# Patient Record
Sex: Female | Born: 1945 | Race: Black or African American | Hispanic: No | Smoking: Never smoker
Health system: Southern US, Community
[De-identification: ages and names within clinical notes are randomized; demographics above are authoritative.]

## PROBLEM LIST (undated history)

## (undated) DIAGNOSIS — D649 Anemia, unspecified: Secondary | ICD-10-CM

## (undated) DIAGNOSIS — K219 Gastro-esophageal reflux disease without esophagitis: Secondary | ICD-10-CM

## (undated) DIAGNOSIS — I10 Essential (primary) hypertension: Secondary | ICD-10-CM

## (undated) HISTORY — PX: ABDOMINAL HYSTERECTOMY: SHX81

---

## 2005-03-06 ENCOUNTER — Other Ambulatory Visit: Payer: Self-pay

## 2005-03-06 ENCOUNTER — Inpatient Hospital Stay: Payer: Self-pay | Admitting: Infectious Diseases

## 2005-03-07 ENCOUNTER — Other Ambulatory Visit: Payer: Self-pay

## 2006-06-25 ENCOUNTER — Ambulatory Visit: Payer: Self-pay | Admitting: Internal Medicine

## 2007-04-06 ENCOUNTER — Other Ambulatory Visit: Payer: Self-pay

## 2007-04-06 ENCOUNTER — Emergency Department: Payer: Self-pay | Admitting: Emergency Medicine

## 2007-10-20 ENCOUNTER — Ambulatory Visit: Payer: Self-pay | Admitting: Internal Medicine

## 2008-06-09 ENCOUNTER — Emergency Department: Payer: Self-pay | Admitting: Emergency Medicine

## 2008-08-30 ENCOUNTER — Ambulatory Visit: Payer: Self-pay | Admitting: Internal Medicine

## 2012-01-19 ENCOUNTER — Ambulatory Visit: Payer: Self-pay | Admitting: Obstetrics and Gynecology

## 2012-01-19 LAB — URINALYSIS, COMPLETE
Bacteria: NONE SEEN
Bilirubin,UR: NEGATIVE
Glucose,UR: NEGATIVE mg/dL
Ketone: NEGATIVE
Leukocyte Esterase: NEGATIVE
Nitrite: NEGATIVE
Ph: 5
Protein: NEGATIVE
RBC,UR: <1 /HPF
Specific Gravity: 1.008
Squamous Epithelial: NONE SEEN
WBC UR: 2 /HPF

## 2012-01-19 LAB — HEMOGLOBIN: HGB: 10.9 g/dL — ABNORMAL LOW

## 2012-02-09 ENCOUNTER — Ambulatory Visit: Payer: Self-pay | Admitting: Obstetrics and Gynecology

## 2012-02-09 LAB — HEMOGLOBIN: HGB: 12.3 g/dL (ref 12.0–16.0)

## 2012-03-17 ENCOUNTER — Emergency Department: Payer: Self-pay | Admitting: Emergency Medicine

## 2012-03-17 LAB — COMPREHENSIVE METABOLIC PANEL
Albumin: 4.6 g/dL (ref 3.4–5.0)
Anion Gap: 12 (ref 7–16)
BUN: 22 mg/dL — ABNORMAL HIGH (ref 7–18)
Co2: 24 mmol/L (ref 21–32)
Creatinine: 0.8 mg/dL (ref 0.60–1.30)
EGFR (African American): >60
EGFR (Non-African Amer.): >60
Glucose: 93 mg/dL (ref 65–99)
Osmolality: 275 (ref 275–301)
SGOT(AST): 22 U/L (ref 15–37)
Total Protein: 8.4 g/dL — ABNORMAL HIGH (ref 6.4–8.2)

## 2012-03-17 LAB — CBC WITH DIFFERENTIAL/PLATELET
Basophil #: 0 10*3/uL (ref 0.0–0.1)
Basophil %: 0.3 %
Eosinophil #: 0.1 10*3/uL (ref 0.0–0.7)
HCT: 33.4 % — ABNORMAL LOW (ref 35.0–47.0)
HGB: 11.1 g/dL — ABNORMAL LOW (ref 12.0–16.0)
Lymphocyte #: 1.1 10*3/uL (ref 1.0–3.6)
Lymphocyte %: 26.2 %
MCH: 28.3 pg (ref 26.0–34.0)
MCHC: 33.2 g/dL (ref 32.0–36.0)
Neutrophil #: 2.7 10*3/uL (ref 1.4–6.5)
Neutrophil %: 63 %
RBC: 3.92 10*6/uL (ref 3.80–5.20)
RDW: 14.3 % (ref 11.5–14.5)

## 2012-03-17 LAB — URINALYSIS, COMPLETE
Bacteria: NONE SEEN
Glucose,UR: NEGATIVE mg/dL (ref 0–75)
Ketone: NEGATIVE
Ph: 5 (ref 4.5–8.0)
RBC,UR: 1 /HPF (ref 0–5)
Specific Gravity: 1.013 (ref 1.003–1.030)
Squamous Epithelial: 1
WBC UR: 3 /HPF (ref 0–5)

## 2012-10-25 ENCOUNTER — Emergency Department: Payer: Self-pay | Admitting: Emergency Medicine

## 2012-10-25 LAB — CBC
HCT: 32.6 % — ABNORMAL LOW (ref 35.0–47.0)
HGB: 10.7 g/dL — ABNORMAL LOW (ref 12.0–16.0)
MCH: 27.5 pg (ref 26.0–34.0)
Platelet: 155 10*3/uL (ref 150–440)
RBC: 3.91 10*6/uL (ref 3.80–5.20)
RDW: 14.5 % (ref 11.5–14.5)
WBC: 8.3 10*3/uL (ref 3.6–11.0)

## 2012-10-25 LAB — URINALYSIS, COMPLETE
Bilirubin,UR: NEGATIVE
Ketone: NEGATIVE
Nitrite: NEGATIVE
Protein: 100
Squamous Epithelial: 4

## 2012-10-25 LAB — COMPREHENSIVE METABOLIC PANEL
Alkaline Phosphatase: 68 U/L (ref 50–136)
Bilirubin,Total: 0.9 mg/dL (ref 0.2–1.0)
Calcium, Total: 8.3 mg/dL — ABNORMAL LOW (ref 8.5–10.1)
Chloride: 104 mmol/L (ref 98–107)
Co2: 26 mmol/L (ref 21–32)
Creatinine: 1.07 mg/dL (ref 0.60–1.30)
Glucose: 97 mg/dL (ref 65–99)
Osmolality: 272 (ref 275–301)
SGOT(AST): 13 U/L — ABNORMAL LOW (ref 15–37)
Sodium: 136 mmol/L (ref 136–145)

## 2014-09-26 ENCOUNTER — Emergency Department: Payer: Self-pay | Admitting: Emergency Medicine

## 2014-11-07 NOTE — Op Note (Signed)
PATIENT NAME:  Amanda Lawrence, Amanda Lawrence MR#:  [id number] DATE OF BIRTH:  [birth year]  DATE OF PROCEDURE:  02/09/2012  PREOPERATIVE DIAGNOSIS: Symptomatic vault relaxation, intolerant of pessary.   POSTOPERATIVE DIAGNOSES:  1. Symptomatic vault relaxation, intolerant of pessary.     2. Multicystic right ovary.   PROCEDURES:   1. Total vaginal hysterectomy.  2. Right salpingo-oophorectomy.  3. Anterior colporrhaphy. 4. Vault suspension.   SURGEON: Ricky Lawrence. Logan BoresEvans, M.D.   ASSISTANT: Jennell Cornerhomas Schermerhorn, M.D.   ANESTHESIA: General endotracheal.   FINDINGS: Postmenopausal vault, early urethral caruncle, firm cystic right ovary, small senile uterus.   ESTIMATED BLOOD LOSS: 30 mL.  COMPLICATIONS: None.  SPECIMENS: Uterus with right ovary.  DRAINS: Foley.  COMPLICATIONS: None.   PROCEDURE IN DETAIL: The patient consented. Preoperative antibiotics were given. The patient was taken to the Operating Room and placed in the supine position. General endotracheal anesthesia was established. She was prepped and draped in the usual sterile fashion. After being positioned in the dorsal lithotomy position using Allen stirrups, the cervix was visualized and grasped with double-tooth tenaculum and injected circumferentially with approximately 12 mL of dilute vasopressin. The cervix was circumscribed with scalpel. Direct entry was made in the posterior cul-de-sac using scissors and long-weighted was placed. A Heaney-Ballantine was used to clamp, cut and tie on the uterosacrals bilaterally. These were tagged. One more clamp on each side allowed us to deliver the uterus which was sent for permanent specimen. The pedicles were ligated with 0 Vicryl.   The right ovary had a multicystic appearance to it and somewhat firm. Therefore, we elected to proceed with right oophorectomy. It was carried out with a clamp of Heaney-Ballantine and a stitch of 0 Vicryl. Areas were checked for hemostasis and seemed to be  excellent.   Uterosacrals were visualized and a Hall band culdoplasty was carried out in the usual fashion using a 0 Vicryl. We then anchored the uterosacrals together and then the posterior two-thirds of the cuff was closed with a running interlocking 0 Vicryl. Next, we turned our attention to the bladder.   An Lavena Bullionllis was placed approximately 2.5 centimeters from the external urethral orifice and an area from the apex of the cuff defect up to the previously placed Allis was injected with dilute vasopressin. It was incised in the midline using Metzenbaum and Allis Adair's were used to grasp these edges to create an incision. The bladder was bluntly and sharply dissected free. Excess bladder tissue was reduced with successive pursestring of 0 Vicryl and then 2-0 Ethibond was used to complete anterior colporrhaphy. Excess skin was trimmed free and anterior defect was closed with a running interlocking including the anterior third of the vaginal cuff. Areas were seen to be hemostatic. Foley catheter was placed with spillage of a good amount of clear urine. Packing was placed, soaked in Premarin cream. This was tied to the Foley.   The patient tolerated the procedure well. She was returned to the supine position, and left in the care of anesthesia. We will discontinue her Foley with packing in the morning and discharge home if appropriate.   ____________________________ Clide Clifficky Lawrence. Logan BoresEvans, MD rle:ap D: 02/09/2012 10:21:28 ET T: 02/09/2012 10:47:36 ET JOB#: [id number]  cc: Clide Clifficky Lawrence. Logan BoresEvans, MD, <Dictator> [name] Laquanda Bick MD ELECTRONICALLY SIGNED 02/11/2012 18:50

## 2014-11-07 NOTE — Discharge Summary (Signed)
PATIENT NAME:  Amanda Lawrence, Amanda Lawrence MR#:  [id number] DATE OF BIRTH:  10-31-1945  DATE OF ADMISSION:  02/09/2012 DATE OF DISCHARGE:  02/11/2012  ADMISSION DIAGNOSIS: Pelvic relaxation and cystic right ovary.   DISCHARGE DIAGNOSIS: Pelvic relaxation and cystic right ovary.   PROCEDURE:  1. Total vaginal hysterectomy. 2. Vault suspension. 3. Anterior repair. 4. Right oophorectomy.   COMPLICATIONS: None.   CONSULTATIONS: None.   HOSPITAL COURSE: Patient did well. She was afebrile throughout, tolerating diet from day one. She was weak, unable to ambulate well on day 1 therefore was kept until day 2. On day 2 she was doing well. Discharged with routine prescriptions, precautions and follow up.   ____________________________ Clide Clifficky Lawrence. Logan BoresEvans, MD rle:cms D: 03/01/2012 07:50:04 ET T: 03/01/2012 14:34:35 ET JOB#: [id number]  cc: Ricky Lawrence. Logan BoresEvans, MD, <Dictator>  [name] [name] MD ELECTRONICALLY SIGNED 03/02/2012 9:24

## 2015-07-05 ENCOUNTER — Encounter: Payer: Self-pay | Admitting: Emergency Medicine

## 2015-07-05 ENCOUNTER — Emergency Department
Admission: EM | Admit: 2015-07-05 | Discharge: 2015-07-05 | Disposition: A | Discharge status: Home / Self Care | Payer: Self-pay | Attending: Emergency Medicine | Admitting: Emergency Medicine

## 2015-07-05 DIAGNOSIS — R531 Weakness: Secondary | ICD-10-CM | POA: Insufficient documentation

## 2015-07-05 DIAGNOSIS — Z880 Allergy status to penicillin: Secondary | ICD-10-CM | POA: Insufficient documentation

## 2015-07-05 DIAGNOSIS — E860 Dehydration: Secondary | ICD-10-CM | POA: Insufficient documentation

## 2015-07-05 DIAGNOSIS — E876 Hypokalemia: Secondary | ICD-10-CM | POA: Insufficient documentation

## 2015-07-05 DIAGNOSIS — K529 Noninfective gastroenteritis and colitis, unspecified: Secondary | ICD-10-CM | POA: Insufficient documentation

## 2015-07-05 LAB — COMPREHENSIVE METABOLIC PANEL
ALT: 16 U/L (ref 14–54)
ANION GAP: 8 (ref 5–15)
AST: 50 U/L — ABNORMAL HIGH (ref 15–41)
Albumin: 3.7 g/dL (ref 3.5–5.0)
Alkaline Phosphatase: 63 U/L (ref 38–126)
BILIRUBIN TOTAL: 0.9 mg/dL (ref 0.3–1.2)
BUN: 25 mg/dL — AB (ref 6–20)
CO2: 24 mmol/L (ref 22–32)
Calcium: 8.7 mg/dL — ABNORMAL LOW (ref 8.9–10.3)
Chloride: 102 mmol/L (ref 101–111)
Creatinine, Ser: 1.12 mg/dL — ABNORMAL HIGH (ref 0.44–1.00)
GFR, EST AFRICAN AMERICAN: 57 mL/min — AB (ref >60)
GFR, EST NON AFRICAN AMERICAN: 49 mL/min — AB (ref >60)
Glucose, Bld: 117 mg/dL — ABNORMAL HIGH (ref 65–99)
POTASSIUM: 2.9 mmol/L — AB (ref 3.5–5.1)
Sodium: 134 mmol/L — ABNORMAL LOW (ref 135–145)
TOTAL PROTEIN: 7.8 g/dL (ref 6.5–8.1)

## 2015-07-05 LAB — CBC WITH DIFFERENTIAL/PLATELET
BASOS ABS: 0 10*3/uL (ref 0–0.1)
BASOS PCT: 0 %
Eosinophils Absolute: 0 10*3/uL (ref 0–0.7)
Eosinophils Relative: 0 %
HEMATOCRIT: 34 % — AB (ref 35.0–47.0)
Hemoglobin: 11 g/dL — ABNORMAL LOW (ref 12.0–16.0)
Lymphocytes Relative: 9 %
Lymphs Abs: 0.4 10*3/uL — ABNORMAL LOW (ref 1.0–3.6)
MCH: 26.9 pg (ref 26.0–34.0)
MCHC: 32.4 g/dL (ref 32.0–36.0)
MCV: 83 fL (ref 80.0–100.0)
MONO ABS: 0.3 10*3/uL (ref 0.2–0.9)
Monocytes Relative: 8 %
NEUTROS ABS: 3.5 10*3/uL (ref 1.4–6.5)
NEUTROS PCT: 83 %
Platelets: 137 10*3/uL — ABNORMAL LOW (ref 150–440)
RBC: 4.1 MIL/uL (ref 3.80–5.20)
RDW: 14 % (ref 11.5–14.5)
WBC: 4.2 10*3/uL (ref 3.6–11.0)

## 2015-07-05 LAB — PHOSPHORUS: PHOSPHORUS: 2 mg/dL — AB (ref 2.5–4.6)

## 2015-07-05 LAB — LIPASE, BLOOD: LIPASE: 28 U/L (ref 11–51)

## 2015-07-05 LAB — MAGNESIUM: Magnesium: 2.1 mg/dL (ref 1.7–2.4)

## 2015-07-05 MED ORDER — PROMETHAZINE HCL 25 MG PO TABS: 25.0000 mg | ORAL_TABLET | Freq: Four times a day (QID) | ORAL | Status: DC | PRN

## 2015-07-05 MED ORDER — CIPROFLOXACIN HCL 500 MG PO TABS: 500.0000 mg | ORAL_TABLET | Freq: Two times a day (BID) | ORAL | Status: DC

## 2015-07-05 MED ORDER — POTASSIUM CHLORIDE CRYS ER 20 MEQ PO TBCR: 20.0000 meq | EXTENDED_RELEASE_TABLET | Freq: Two times a day (BID) | ORAL | Status: DC

## 2015-07-05 MED ORDER — SODIUM CHLORIDE 0.9 % IV BOLUS (SEPSIS)
1000.0000 mL | Freq: Once | INTRAVENOUS | Status: AC
  Administered 2015-07-05: 1000 mL via INTRAVENOUS

## 2015-07-05 MED ORDER — ACETAMINOPHEN 325 MG PO TABS
650.0000 mg | ORAL_TABLET | Freq: Once | ORAL | Status: AC
  Administered 2015-07-05: 650 mg via ORAL
  Filled 2015-07-05: qty 2

## 2015-07-05 MED ORDER — METRONIDAZOLE 500 MG PO TABS: 500.0000 mg | ORAL_TABLET | Freq: Three times a day (TID) | ORAL | Status: DC

## 2015-07-05 MED ORDER — POTASSIUM CHLORIDE CRYS ER 20 MEQ PO TBCR
EXTENDED_RELEASE_TABLET | ORAL | Status: AC
Start: 2015-07-05 — End: 2015-07-05
  Administered 2015-07-05: 40 meq
  Filled 2015-07-05: qty 2

## 2015-07-05 NOTE — ED Notes (Signed)
Pt presents with diarrhea and weakness started yesterday. Denies any vomiting and or pain.

## 2015-07-05 NOTE — Discharge Instructions (Signed)
Dehydration, Adult °Dehydration is a condition in which you do not have enough fluid or water in your body. It happens when you take in less fluid than you lose. Vital organs such as the kidneys, brain, and heart cannot function without a proper amount of fluids. Any loss of fluids from the body can cause dehydration.  °Dehydration can range from mild to severe. This condition should be treated right away to help prevent it from becoming severe. °CAUSES  °This condition may be caused by: °· Vomiting. °· Diarrhea. °· Excessive sweating, such as when exercising in hot or humid weather. °· Not drinking enough fluid during strenuous exercise or during an illness. °· Excessive urine output. °· Fever. °· Certain medicines. °RISK FACTORS °This condition is more likely to develop in: °· People who are taking certain medicines that cause the body to lose excess fluid (diuretics).   °· People who have a chronic illness, such as diabetes, that may increase urination. °· Older adults.   °· People who live at high altitudes.   °· People who participate in endurance sports.   °SYMPTOMS  °Mild Dehydration °· Thirst. °· Dry lips. °· Slightly dry mouth. °· Dry, warm skin. °Moderate Dehydration °· Very dry mouth.   °· Muscle cramps.   °· Dark urine and decreased urine production.   °· Decreased tear production.   °· Headache.   °· Light-headedness, especially when you stand up from a sitting position.   °Severe Dehydration °· Changes in skin.   °¨ Cold and clammy skin.   °¨ Skin does not spring back quickly when lightly pinched and released.   °· Changes in body fluids.   °¨ Extreme thirst.   °¨ No tears.   °¨ Not able to sweat when body temperature is high, such as in hot weather.   °¨ Minimal urine production.   °· Changes in vital signs.   °¨ Rapid, weak pulse (more than 100 beats per minute when you are sitting still).   °¨ Rapid breathing.   °¨ Low blood pressure.   °· Other changes.   °¨ Sunken eyes.   °¨ Cold hands and feet.    °¨ Confusion. °¨ Lethargy and difficulty being awakened. °¨ Fainting (syncope).   °¨ Short-term weight loss.   °¨ Unconsciousness. °DIAGNOSIS  °This condition may be diagnosed based on your symptoms. You may also have tests to determine how severe your dehydration is. These tests may include:  °· Urine tests.   °· Blood tests.   °TREATMENT  °Treatment for this condition depends on the severity. Mild or moderate dehydration can often be treated at home. Treatment should be started right away. Do not wait until dehydration becomes severe. Severe dehydration needs to be treated at the hospital. °Treatment for Mild Dehydration °· Drinking plenty of water to replace the fluid you have lost.   °· Replacing minerals in your blood (electrolytes) that you may have lost.   °Treatment for Moderate Dehydration  °· Consuming oral rehydration solution (ORS). °Treatment for Severe Dehydration °· Receiving fluid through an IV tube.   °· Receiving electrolyte solution through a feeding tube that is passed through your nose and into your stomach (nasogastric tube or NG tube). °· Correcting any abnormalities in electrolytes. °HOME CARE INSTRUCTIONS  °· Drink enough fluid to keep your urine clear or pale yellow.   °· Drink water or fluid slowly by taking small sips. You can also try sucking on ice cubes.  °· Have food or beverages that contain electrolytes. Examples include bananas and sports drinks. °· Take over-the-counter and prescription medicines only as told by your health care provider.   °· Prepare ORS according to the manufacturer's instructions. Take sips   of ORS every 5 minutes until your urine returns to normal. °· If you have vomiting or diarrhea, continue to try to drink water, ORS, or both.   °· If you have diarrhea, avoid:   °¨ Beverages that contain caffeine.   °¨ Fruit juice.   °¨ Milk.    °¨ Carbonated soft drinks. °· Do not take salt tablets. This can lead to the condition of having too much sodium in your body  (hypernatremia).   °SEEK MEDICAL CARE IF: °· You cannot eat or drink without vomiting. °· You have had moderate diarrhea during a period of more than 24 hours. °· You have a fever. °SEEK IMMEDIATE MEDICAL CARE IF:  °· You have extreme thirst. °· You have severe diarrhea. °· You have not urinated in 6-8 hours, or you have urinated only a small amount of very dark urine. °· You have shriveled skin. °· You are dizzy, confused, or both. °  °This information is not intended to replace advice given to you by your health care provider. Make sure you discuss any questions you have with your health care provider. °  °Document Released: 07/07/2005 Document Revised: 03/28/2015 Document Reviewed: 11/22/2014 °Elsevier Interactive Patient Education ©2016 [employer] °Colitis °Colitis is inflammation of the colon. Colitis may last a short time (acute) or it may last a long time (chronic). °CAUSES °This condition may be caused by: °· Viruses. °· Bacteria. °· Reactions to medicine. °· Certain autoimmune diseases, such as Crohn disease or ulcerative colitis. °SYMPTOMS °Symptoms of this condition include: °· Diarrhea. °· Passing bloody or tarry stool. °· Pain. °· Fever. °· Vomiting. °· Tiredness (fatigue). °· Weight loss. °· Bloating. °· Sudden increase in abdominal pain. °· Having fewer bowel movements than usual. °DIAGNOSIS °This condition is diagnosed with a stool test or a blood test. You may also have other tests, including X-rays, a CT scan, or a colonoscopy. °TREATMENT °Treatment may include: °· Resting the bowel. This involves not eating or drinking for a period of time. °· Fluids that are given through an IV tube. °· Medicine for pain and diarrhea. °· Antibiotic medicines. °· Cortisone medicines. °· Surgery. °HOME CARE INSTRUCTIONS °Eating and Drinking °· Follow instructions from your health care provider about eating or drinking restrictions. °· Drink enough fluid to keep your urine clear or pale yellow. °· Work with a  dietitian to determine which foods cause your condition to flare up. °· Avoid foods that cause flare-ups. °· Eat a well-balanced diet. °Medicines °· Take over-the-counter and prescription medicines only as told by your health care provider. °· If you were prescribed an antibiotic medicine, take it as told by your health care provider. Do not stop taking the antibiotic even if you start to feel better. °General Instructions °· Keep all follow-up visits as told by your health care provider. This is important. °SEEK MEDICAL CARE IF: °· Your symptoms do not go away. °· You develop new symptoms. °SEEK IMMEDIATE MEDICAL CARE IF: °· You have a fever that does not go away with treatment. °· You develop chills. °· You have extreme weakness, fainting, or dehydration. °· You have repeated vomiting. °· You develop severe pain in your abdomen. °· You pass bloody or tarry stool. °  °This information is not intended to replace advice given to you by your health care provider. Make sure you discuss any questions you have with your health care provider. °  °Document Released: 08/14/2004 Document Revised: 03/28/2015 Document Reviewed: 10/30/2014 °Elsevier Interactive Patient Education ©2016 [employer] ° °

## 2015-07-05 NOTE — ED Provider Notes (Signed)
[facility] Emergency Department Provider Note  ____________________________________________  Time seen: 8:00 AM  I have reviewed the triage vital signs and the nursing notes.   HISTORY  Chief Complaint Weakness    HPI Amanda Lawrence is a 69 y.o. female who complains of generalized weakness and watery diarrhea that started yesterday morning. She's had frequent watery bowel movements throughout the last 24 hours and progressively more and more fatigued. She stated bed for most of yesterday and all night apart from having to go to the bathroom. She's had multiple episodes of loss of control and soiling her clothes and linens. Denies any sick contacts. No chest pain shortness of breath or fever but does have chills. Denies any abdominal pain or back pain.     History reviewed. No pertinent past medical history.   There are no active problems to display for this patient.    Past Surgical History  Procedure Laterality Date  . Abdominal hysterectomy       Current Outpatient Rx  Name  Route  Sig  Dispense  Refill  . ciprofloxacin (CIPRO) 500 MG tablet   Oral   Take 1 tablet (500 mg total) by mouth 2 (two) times daily.   14 tablet   0   . metroNIDAZOLE (FLAGYL) 500 MG tablet   Oral   Take 1 tablet (500 mg total) by mouth 3 (three) times daily.   30 tablet   0   . potassium chloride SA (K-DUR,KLOR-CON) 20 MEQ tablet   Oral   Take 1 tablet (20 mEq total) by mouth 2 (two) times daily.   10 tablet   0   . promethazine (PHENERGAN) 25 MG tablet   Oral   Take 1 tablet (25 mg total) by mouth every 6 (six) hours as needed for nausea or vomiting.   15 tablet   0      Allergies Penicillins   No family history on file.  Social History Social History  Substance Use Topics  . Smoking status: Never Smoker   . Smokeless tobacco: None  . Alcohol Use: No    Review of Systems  Constitutional:   No fever positive chills. No  weight changes Eyes:   No blurry vision or double vision.  ENT:   No sore throat. Cardiovascular:   No chest pain. Respiratory:   No dyspnea or cough. Gastrointestinal:   Negative for abdominal pain, or vomiting . Patient has frequent watery diarrhea.  No BRBPR or melena. Genitourinary:   Negative for dysuria, urinary retention, bloody urine, or difficulty urinating. Musculoskeletal:   Negative for back pain. No joint swelling or pain. Skin:   Negative for rash. Neurological:   Negative for headaches, focal weakness or numbness. Psychiatric:  No anxiety or depression.   Endocrine:  No hot/cold intolerance, changes in energy, or sleep difficulty.  10-point ROS otherwise negative.  ____________________________________________   PHYSICAL EXAM:  VITAL SIGNS: ED Triage Vitals  Enc Vitals Group     BP 07/05/15 0755 162/53 mmHg     Pulse Rate 07/05/15 0755 110     Resp 07/05/15 0755 18     Temp 07/05/15 0755 102.8 F (39.3 C)     Temp Source 07/05/15 0755 Oral     SpO2 07/05/15 0755 98 %     Weight 07/05/15 0755 210 lb (95.255 kg)     Height 07/05/15 0755  (1.676 m)     Head Cir --      Peak  Flow --      Pain Score --      Pain Loc --      Pain Edu? --      Excl. in GC? --      Constitutional:   Alert and oriented. Well appearing and in no distress. Eyes:   No scleral icterus. No conjunctival pallor. PERRL. EOMI ENT   Head:   Normocephalic and atraumatic.   Nose:   No congestion/rhinnorhea. No septal hematoma   Mouth/Throat:   Dry mucous membranes, no pharyngeal erythema. No peritonsillar mass. No uvula shift.   Neck:   No stridor. No SubQ emphysema. No meningismus. Hematological/Lymphatic/Immunilogical:   No cervical lymphadenopathy. Cardiovascular:   Heart rate 90 at rest supine, increases to 105 when sitting upright.. Normal and symmetric distal pulses are present in all extremities. No murmurs, rubs, or gallops. Respiratory:   Normal respiratory  effort without tachypnea nor retractions. Breath sounds are clear and equal bilaterally. No wheezes/rales/rhonchi. Gastrointestinal:   Soft and nontender. No distention. There is no CVA tenderness.  No rebound, rigidity, or guarding. Genitourinary:   deferred Musculoskeletal:   Nontender with normal range of motion in all extremities. No joint effusions.  No lower extremity tenderness.  No edema. Neurologic:   Normal speech and language.  CN 2-10 normal. Motor grossly intact. No pronator drift.  Normal gait. No gross focal neurologic deficits are appreciated.  Skin:    Skin is warm, dry and intact. No rash noted.  No petechiae, purpura, or bullae. Psychiatric:   Mood and affect are normal. Speech and behavior are normal. Patient exhibits appropriate insight and judgment.  ____________________________________________    LABS (pertinent positives/negatives) (all labs ordered are listed, but only abnormal results are displayed) Labs Reviewed  COMPREHENSIVE METABOLIC PANEL - Abnormal; Notable for the following:    Sodium 134 (*)    Potassium 2.9 (*)    Glucose, Bld 117 (*)    BUN 25 (*)    Creatinine, Ser 1.12 (*)    Calcium 8.7 (*)    AST 50 (*)    GFR calc non Af Amer 49 (*)    GFR calc Af Amer 57 (*)    All other components within normal limits  CBC WITH DIFFERENTIAL/PLATELET - Abnormal; Notable for the following:    Hemoglobin 11.0 (*)    HCT 34.0 (*)    Platelets 137 (*)    Lymphs Abs 0.4 (*)    All other components within normal limits  PHOSPHORUS - Abnormal; Notable for the following:    Phosphorus 2.0 (*)    All other components within normal limits  LIPASE, BLOOD  MAGNESIUM  URINALYSIS COMPLETEWITH MICROSCOPIC ([facility] ONLY)   ____________________________________________   EKG  Interpreted by me Sinus tachycardia rate 100, normal axis intervals QRS and ST segments. Diffuse T-wave inversions in 2-3 aVF, V3 through V6. Frequent PVCs, with 5 PVCs on the strip. T wave  inversions are unchanged from 10/25/2012. The PVCs are new.  ____________________________________________    RADIOLOGY    ____________________________________________   PROCEDURES   ____________________________________________   INITIAL IMPRESSION / ASSESSMENT AND PLAN / ED COURSE  Pertinent labs & imaging results that were available during my care of the patient were reviewed by me and considered in my medical decision making (see chart for details).  Patient presents with generalized weakness without significant pain. Exam is suggestive of dehydration. She may also have some electrolyte abnormalities that are causing her weakness and ectopy on EKG. We'll give IV  fluids and check labs and urinalysis. We'll also check a stool GI panel due to her tachycardia fever and somewhat advanced age.  ----------------------------------------- 9:43 AM on 07/05/2015 -----------------------------------------  Patient feeling about the same, still malaise and fatigue. Heart rate 80 at rest, remain stable when sitting upright. Improvement in orthostatic symptoms. Clinically she has enteritis of unknown etiology. She has not had any bowel movements in the 2 hours she's been in the emergency department side low suspicion that she has C. difficile and she has no risk factors for it. However with her fever and dehydration is resolved and some mild hypokalemia, I will start her on a potassium supplement, antibiotics and nausea medicine as needed. I think she is low risk for hemolytic uremic syndrome. We'll have her follow up with primary care within 1 week.   ____________________________________________   FINAL CLINICAL IMPRESSION(S) / ED DIAGNOSES  Final diagnoses:  Enteritis  Mild dehydration  Hypokalemia      Sharman Cheek, MD 07/05/15 [id number]

## 2015-07-05 NOTE — ED Notes (Signed)
MD at bedside. 

## 2015-07-13 ENCOUNTER — Emergency Department
Admission: EM | Admit: 2015-07-13 | Discharge: 2015-07-13 | Disposition: A | Discharge status: Home / Self Care | Payer: BLUE CROSS/BLUE SHIELD | Attending: Emergency Medicine | Admitting: Emergency Medicine

## 2015-07-13 ENCOUNTER — Emergency Department: Payer: BLUE CROSS/BLUE SHIELD

## 2015-07-13 ENCOUNTER — Encounter: Payer: Self-pay | Admitting: Emergency Medicine

## 2015-07-13 DIAGNOSIS — X58XXXA Exposure to other specified factors, initial encounter: Secondary | ICD-10-CM | POA: Insufficient documentation

## 2015-07-13 DIAGNOSIS — Z880 Allergy status to penicillin: Secondary | ICD-10-CM | POA: Insufficient documentation

## 2015-07-13 DIAGNOSIS — Y9289 Other specified places as the place of occurrence of the external cause: Secondary | ICD-10-CM | POA: Insufficient documentation

## 2015-07-13 DIAGNOSIS — M1711 Unilateral primary osteoarthritis, right knee: Secondary | ICD-10-CM | POA: Diagnosis not present

## 2015-07-13 DIAGNOSIS — Z792 Long term (current) use of antibiotics: Secondary | ICD-10-CM | POA: Insufficient documentation

## 2015-07-13 DIAGNOSIS — Z79899 Other long term (current) drug therapy: Secondary | ICD-10-CM | POA: Diagnosis not present

## 2015-07-13 DIAGNOSIS — S8991XA Unspecified injury of right lower leg, initial encounter: Secondary | ICD-10-CM | POA: Diagnosis present

## 2015-07-13 DIAGNOSIS — Y9389 Activity, other specified: Secondary | ICD-10-CM | POA: Diagnosis not present

## 2015-07-13 DIAGNOSIS — Y998 Other external cause status: Secondary | ICD-10-CM | POA: Diagnosis not present

## 2015-07-13 MED ORDER — NAPROXEN 500 MG PO TABS: 500.0000 mg | ORAL_TABLET | Freq: Two times a day (BID) | ORAL | Status: DC

## 2015-07-13 MED ORDER — HYDROCODONE-ACETAMINOPHEN 5-325 MG PO TABS
2.0000 | ORAL_TABLET | Freq: Once | ORAL | Status: AC
  Administered 2015-07-13: 2 via ORAL

## 2015-07-13 MED ORDER — PREDNISONE 10 MG PO TABS: 50.0000 mg | ORAL_TABLET | Freq: Every day | ORAL | Status: DC

## 2015-07-13 MED ORDER — HYDROCODONE-ACETAMINOPHEN 5-325 MG PO TABS
ORAL_TABLET | ORAL | Status: AC
  Administered 2015-07-13: 2 via ORAL
  Filled 2015-07-13: qty 2

## 2015-07-13 MED ORDER — HYDROCODONE-ACETAMINOPHEN 5-325 MG PO TABS: 1.0000 | ORAL_TABLET | ORAL | Status: DC | PRN

## 2015-07-13 NOTE — ED Provider Notes (Signed)
[facility] Emergency Department Provider Note  ____________________________________________  Time seen: Approximately 9:01 AM  I have reviewed the triage vital signs and the nursing notes.   HISTORY  Chief Complaint Knee Pain    HPI Amanda Lawrence is a 69 y.o. female presents today with complaints of right knee pain times one day. Patient felt a "pop sensation" while bending her knees last PM. States sleeping was difficult.   History reviewed. No pertinent past medical history.  There are no active problems to display for this patient.   Past Surgical History  Procedure Laterality Date  . Abdominal hysterectomy      Current Outpatient Rx  Name  Route  Sig  Dispense  Refill  . ciprofloxacin (CIPRO) 500 MG tablet   Oral   Take 1 tablet (500 mg total) by mouth 2 (two) times daily.   14 tablet   0   . HYDROcodone-acetaminophen (NORCO) 5-325 MG tablet   Oral   Take 1-2 tablets by mouth every 4 (four) hours as needed for moderate pain.   15 tablet   0   . metroNIDAZOLE (FLAGYL) 500 MG tablet   Oral   Take 1 tablet (500 mg total) by mouth 3 (three) times daily.   30 tablet   0   . naproxen (NAPROSYN) 500 MG tablet   Oral   Take 1 tablet (500 mg total) by mouth 2 (two) times daily with a meal.   60 tablet   0   . potassium chloride SA (K-DUR,KLOR-CON) 20 MEQ tablet   Oral   Take 1 tablet (20 mEq total) by mouth 2 (two) times daily.   10 tablet   0   . predniSONE (DELTASONE) 10 MG tablet   Oral   Take 5 tablets (50 mg total) by mouth daily with breakfast.   25 tablet   0   . promethazine (PHENERGAN) 25 MG tablet   Oral   Take 1 tablet (25 mg total) by mouth every 6 (six) hours as needed for nausea or vomiting.   15 tablet   0     Allergies Penicillins  No family history on file.  Social History Social History  Substance Use Topics  . Smoking status: Never Smoker   . Smokeless tobacco: None  . Alcohol  Use: No    Review of Systems Constitutional: No fever/chills Eyes: No visual changes. ENT: No sore throat. Cardiovascular: Denies chest pain. Respiratory: Denies shortness of breath. Gastrointestinal: No abdominal pain.  No nausea, no vomiting.  No diarrhea.  No constipation. Genitourinary: Negative for dysuria. Musculoskeletal: Positive for right knee pain. Skin: Negative for rash. Neurological: Negative for headaches, focal weakness or numbness.  10-point ROS otherwise negative.  ____________________________________________   PHYSICAL EXAM:  VITAL SIGNS: ED Triage Vitals  Enc Vitals Group     BP --      Pulse --      Resp --      Temp --      Temp src --      SpO2 --      Weight --      Height --      Head Cir --      Peak Flow --      Pain Score --      Pain Loc --      Pain Edu? --      Excl. in GC? --     Constitutional: Alert and oriented. Well appearing and  in no acute distress.  Cardiovascular: Normal rate, regular rhythm. Grossly normal heart sounds.  Good peripheral circulation. Respiratory: Normal respiratory effort.  No retractions. Lungs CTAB. Musculoskeletal: Right knee tenderness. No ecchymosis edema or effusion noted. Neurovascularly intact. Patient able to move toes but unable to flex her knee. Neurologic:  Normal speech and language. No gross focal neurologic deficits are appreciated. No gait instability. Skin:  Skin is warm, dry and intact. No rash noted. Psychiatric: Mood and affect are normal. Speech and behavior are normal.  ____________________________________________   LABS (all labs ordered are listed, but only abnormal results are displayed)  Labs Reviewed - No data to display ____________________________________________  RADIOLOGY  Areas of osteoarthritic change. No fracture or dislocation. No appreciable joint effusion. ____________________________________________   PROCEDURES  Procedure(s) performed: None  Critical Care  performed: No  ____________________________________________   INITIAL IMPRESSION / ASSESSMENT AND PLAN / ED COURSE  Pertinent labs & imaging results that were available during my care of the patient were reviewed by me and considered in my medical decision making (see chart for details).  Acute right knee pain. 3 osteoarthritic changes. Rx given for Naprosyn 500 mg twice a day, 5 day burst of prednisone 50 mg daily. Patient to follow up with PCP or return to the ER with any worsening symptomology. Referral given orthopedics on call. ____________________________________________   FINAL CLINICAL IMPRESSION(S) / ED DIAGNOSES  Final diagnoses:  Osteoarthritis of right knee, unspecified osteoarthritis type      Evangeline Dakin, PA-C 07/13/15 1478  Jennye Moccasin, MD 07/13/15 1021

## 2015-07-13 NOTE — ED Notes (Signed)
States she developed right knee pain yesterday  Felt a pop while bending her knee yesterday

## 2015-07-13 NOTE — Discharge Instructions (Signed)
Heat Therapy °Heat therapy can help ease sore, stiff, injured, and tight muscles and joints. Heat relaxes your muscles, which may help ease your pain. Heat therapy should only be used on old, pre-existing, or long-lasting (chronic) injuries. Do not use heat therapy unless told by your doctor. °HOW TO USE HEAT THERAPY °There are several different kinds of heat therapy, including: °· Moist heat pack. °· Warm water bath. °· Hot water bottle. °· Electric heating pad. °· Heated gel pack. °· Heated wrap. °· Electric heating pad. °GENERAL HEAT THERAPY RECOMMENDATIONS  °· Do not sleep while using heat therapy. Only use heat therapy while you are awake. °· Your skin may turn pink while using heat therapy. Do not use heat therapy if your skin turns red. °· Do not use heat therapy if you have new pain. °· High heat or long exposure to heat can cause burns. Be careful when using heat therapy to avoid burning your skin. °· Do not use heat therapy on areas of your skin that are already irritated, such as with a rash or sunburn. °GET HELP IF:  °· You have blisters, redness, swelling (puffiness), or numbness. °· You have new pain. °· Your pain is worse. °MAKE SURE YOU: °· Understand these instructions. °· Will watch your condition. °· Will get help right away if you are not doing well or get worse. °  °This information is not intended to replace advice given to you by your health care provider. Make sure you discuss any questions you have with your health care provider. °  °Document Released: 09/29/2011 Document Revised: 07/28/2014 Document Reviewed: 08/30/2013 °Elsevier Interactive Patient Education ©2016 [employer] ° °Osteoarthritis °Osteoarthritis is a disease that causes soreness and inflammation of a joint. It occurs when the cartilage at the affected joint wears down. Cartilage acts as a cushion, covering the ends of bones where they meet to form a joint. Osteoarthritis is the most common form of arthritis. It often occurs  in older people. The joints affected most often by this condition include those in the: °· Ends of the fingers. °· Thumbs. °· Neck. °· Lower back. °· Knees. °· Hips. °CAUSES  °Over time, the cartilage that covers the ends of bones begins to wear away. This causes bone to rub on bone, producing pain and stiffness in the affected joints.  °RISK FACTORS °Certain factors can increase your chances of having osteoarthritis, including: °· Older age. °· Excessive body weight. °· Overuse of joints. °· Previous joint injury. °SIGNS AND SYMPTOMS  °· Pain, swelling, and stiffness in the joint. °· Over time, the joint may lose its normal shape. °· Small deposits of bone (osteophytes) may grow on the edges of the joint. °· Bits of bone or cartilage can break off and float inside the joint space. This may cause more pain and damage. °DIAGNOSIS  °Your health care provider will do a physical exam and ask about your symptoms. Various tests may be ordered, such as: °· X-rays of the affected joint. °· Blood tests to rule out other types of arthritis. °Additional tests may be used to diagnose your condition. °TREATMENT  °Goals of treatment are to control pain and improve joint function. Treatment plans may include: °· A prescribed exercise program that allows for rest and joint relief. °· A weight control plan. °· Pain relief techniques, such as: °¨ Properly applied heat and cold. °¨ Electric pulses delivered to nerve endings under the skin (transcutaneous electrical nerve stimulation [TENS]). °¨ Massage. °¨ Certain nutritional   supplements. °· Medicines to control pain, such as: °¨ Acetaminophen. °¨ Nonsteroidal anti-inflammatory drugs (NSAIDs), such as naproxen. °¨ Narcotic or central-acting agents, such as tramadol. °¨ Corticosteroids. These can be given orally or as an injection. °· Surgery to reposition the bones and relieve pain (osteotomy) or to remove loose pieces of bone and cartilage. Joint replacement may be needed in advanced  states of osteoarthritis. °HOME CARE INSTRUCTIONS  °· Take medicines only as directed by your health care provider. °· Maintain a healthy weight. Follow your health care provider's instructions for weight control. This may include dietary instructions. °· Exercise as directed. Your health care provider can recommend specific types of exercise. These may include: °¨ Strengthening exercises. These are done to strengthen the muscles that support joints affected by arthritis. They can be performed with weights or with exercise bands to add resistance. °¨ Aerobic activities. These are exercises, such as brisk walking or low-impact aerobics, that get your heart pumping. °¨ Range-of-motion activities. These keep your joints limber. °¨ Balance and agility exercises. These help you maintain daily living skills. °· Rest your affected joints as directed by your health care provider. °· Keep all follow-up visits as directed by your health care provider. °SEEK MEDICAL CARE IF:  °· Your skin turns red. °· You develop a rash in addition to your joint pain. °· You have worsening joint pain. °· You have a fever along with joint or muscle aches. °SEEK IMMEDIATE MEDICAL CARE IF: °· You have a significant loss of weight or appetite. °· You have night sweats. °FOR MORE INFORMATION  °· National Institute of Arthritis and Musculoskeletal and Skin Diseases: www.niams.nih.gov °· National Institute on Aging: www.nia.nih.gov °· American College of Rheumatology: www.rheumatology.org °  °This information is not intended to replace advice given to you by your health care provider. Make sure you discuss any questions you have with your health care provider. °  °Document Released: 07/07/2005 Document Revised: 07/28/2014 Document Reviewed: 03/14/2013 °Elsevier Interactive Patient Education ©2016 Elsevier Inc. ° °

## 2015-07-13 NOTE — ED Notes (Signed)
Having pain to right knee this am  States she bent her knee and felt a pop describes pain as burning type pain and unable bear full wt.

## 2015-07-19 ENCOUNTER — Emergency Department: Payer: BLUE CROSS/BLUE SHIELD

## 2015-07-19 ENCOUNTER — Encounter: Payer: Self-pay | Admitting: Emergency Medicine

## 2015-07-19 ENCOUNTER — Emergency Department
Admission: EM | Admit: 2015-07-19 | Discharge: 2015-07-19 | Disposition: A | Discharge status: Home / Self Care | Payer: BLUE CROSS/BLUE SHIELD | Attending: Emergency Medicine | Admitting: Emergency Medicine

## 2015-07-19 DIAGNOSIS — R1011 Right upper quadrant pain: Secondary | ICD-10-CM

## 2015-07-19 DIAGNOSIS — Z7952 Long term (current) use of systemic steroids: Secondary | ICD-10-CM | POA: Insufficient documentation

## 2015-07-19 DIAGNOSIS — Z791 Long term (current) use of non-steroidal anti-inflammatories (NSAID): Secondary | ICD-10-CM | POA: Insufficient documentation

## 2015-07-19 DIAGNOSIS — Z880 Allergy status to penicillin: Secondary | ICD-10-CM | POA: Insufficient documentation

## 2015-07-19 DIAGNOSIS — Z792 Long term (current) use of antibiotics: Secondary | ICD-10-CM | POA: Insufficient documentation

## 2015-07-19 DIAGNOSIS — N390 Urinary tract infection, site not specified: Secondary | ICD-10-CM | POA: Diagnosis not present

## 2015-07-19 DIAGNOSIS — R109 Unspecified abdominal pain: Secondary | ICD-10-CM

## 2015-07-19 DIAGNOSIS — Z79899 Other long term (current) drug therapy: Secondary | ICD-10-CM | POA: Diagnosis not present

## 2015-07-19 HISTORY — DX: Essential (primary) hypertension: I10

## 2015-07-19 LAB — COMPREHENSIVE METABOLIC PANEL
ALK PHOS: 56 U/L (ref 38–126)
ALT: 16 U/L (ref 14–54)
ANION GAP: 7 (ref 5–15)
AST: 22 U/L (ref 15–41)
Albumin: 3.2 g/dL — ABNORMAL LOW (ref 3.5–5.0)
BILIRUBIN TOTAL: 0.5 mg/dL (ref 0.3–1.2)
BUN: 41 mg/dL — ABNORMAL HIGH (ref 6–20)
CALCIUM: 8.7 mg/dL — AB (ref 8.9–10.3)
CO2: 28 mmol/L (ref 22–32)
Chloride: 106 mmol/L (ref 101–111)
Creatinine, Ser: 1.03 mg/dL — ABNORMAL HIGH (ref 0.44–1.00)
GFR calc non Af Amer: 54 mL/min — ABNORMAL LOW (ref >60)
Glucose, Bld: 90 mg/dL (ref 65–99)
POTASSIUM: 4.1 mmol/L (ref 3.5–5.1)
SODIUM: 141 mmol/L (ref 135–145)
TOTAL PROTEIN: 7 g/dL (ref 6.5–8.1)

## 2015-07-19 LAB — URINALYSIS COMPLETE WITH MICROSCOPIC (ARMC ONLY)
BILIRUBIN URINE: NEGATIVE
Bacteria, UA: NONE SEEN
GLUCOSE, UA: NEGATIVE mg/dL
Hgb urine dipstick: NEGATIVE
KETONES UR: NEGATIVE mg/dL
NITRITE: NEGATIVE
Protein, ur: NEGATIVE mg/dL
SPECIFIC GRAVITY, URINE: 1.031 — AB (ref 1.005–1.030)
pH: 5 (ref 5.0–8.0)

## 2015-07-19 LAB — CBC WITH DIFFERENTIAL/PLATELET
Basophils Absolute: 0.1 10*3/uL (ref 0–0.1)
Basophils Relative: 1 %
EOS ABS: 0.1 10*3/uL (ref 0–0.7)
Eosinophils Relative: 0 %
HCT: 29.4 % — ABNORMAL LOW (ref 35.0–47.0)
HEMOGLOBIN: 9.6 g/dL — AB (ref 12.0–16.0)
LYMPHS ABS: 1.7 10*3/uL (ref 1.0–3.6)
Lymphocytes Relative: 15 %
MCH: 26.6 pg (ref 26.0–34.0)
MCHC: 32.5 g/dL (ref 32.0–36.0)
MCV: 81.8 fL (ref 80.0–100.0)
MONO ABS: 0.6 10*3/uL (ref 0.2–0.9)
MONOS PCT: 5 %
Neutro Abs: 9.3 10*3/uL — ABNORMAL HIGH (ref 1.4–6.5)
Neutrophils Relative %: 79 %
Platelets: 444 10*3/uL — ABNORMAL HIGH (ref 150–440)
RBC: 3.6 MIL/uL — ABNORMAL LOW (ref 3.80–5.20)
RDW: 14.9 % — AB (ref 11.5–14.5)
WBC: 11.7 10*3/uL — AB (ref 3.6–11.0)

## 2015-07-19 LAB — LIPASE, BLOOD: Lipase: 50 U/L (ref 11–51)

## 2015-07-19 MED ORDER — MORPHINE SULFATE (PF) 4 MG/ML IV SOLN
4.0000 mg | Freq: Once | INTRAVENOUS | Status: AC
  Administered 2015-07-19: 4 mg via INTRAVENOUS
  Filled 2015-07-19: qty 1

## 2015-07-19 MED ORDER — SODIUM CHLORIDE 0.9 % IV BOLUS (SEPSIS)
1000.0000 mL | Freq: Once | INTRAVENOUS | Status: AC
  Administered 2015-07-19: 1000 mL via INTRAVENOUS

## 2015-07-19 MED ORDER — IOHEXOL 240 MG/ML SOLN
25.0000 mL | Freq: Once | INTRAMUSCULAR | Status: AC | PRN
  Administered 2015-07-19: 25 mL via ORAL

## 2015-07-19 MED ORDER — SULFAMETHOXAZOLE-TRIMETHOPRIM 800-160 MG PO TABS
1.0000 | ORAL_TABLET | Freq: Once | ORAL | Status: AC
  Administered 2015-07-19: 1 via ORAL
  Filled 2015-07-19: qty 1

## 2015-07-19 MED ORDER — HYDROCODONE-ACETAMINOPHEN 5-325 MG PO TABS: 2.0000 | ORAL_TABLET | Freq: Four times a day (QID) | ORAL | Status: DC | PRN

## 2015-07-19 MED ORDER — IOHEXOL 300 MG/ML  SOLN
100.0000 mL | Freq: Once | INTRAMUSCULAR | Status: AC | PRN
  Administered 2015-07-19: 100 mL via INTRAVENOUS

## 2015-07-19 MED ORDER — SULFAMETHOXAZOLE-TRIMETHOPRIM 800-160 MG PO TABS: 1.0000 | ORAL_TABLET | Freq: Two times a day (BID) | ORAL | Status: DC

## 2015-07-19 MED ORDER — ONDANSETRON HCL 4 MG/2ML IJ SOLN
4.0000 mg | Freq: Once | INTRAMUSCULAR | Status: AC
  Administered 2015-07-19: 4 mg via INTRAVENOUS
  Filled 2015-07-19: qty 2

## 2015-07-19 NOTE — ED Notes (Addendum)
Pt returned from US. Alert and oriented. NAD. Skin warm dry and pink. Respirations unlabored. Pain from right side to right flank.

## 2015-07-19 NOTE — Discharge Instructions (Signed)

## 2015-07-19 NOTE — ED Provider Notes (Signed)
[facility] Emergency Department Provider Note  ____________________________________________  Time seen: 4:30 AM  I have reviewed the triage vital signs and the nursing notes.   HISTORY  Chief Complaint Flank Pain     HPI Amanda Lawrence is a 69 y.o. female presents with right flank pain with acute onset at 8 PM last night current pain score described as 10 out of 10.     Past medical history No pertinent past medical history  There are no active problems to display for this patient.   Past Surgical History  Procedure Laterality Date  . Abdominal hysterectomy      Current Outpatient Rx  Name  Route  Sig  Dispense  Refill  . ciprofloxacin (CIPRO) 500 MG tablet   Oral   Take 1 tablet (500 mg total) by mouth 2 (two) times daily.   14 tablet   0   . HYDROcodone-acetaminophen (NORCO) 5-325 MG tablet   Oral   Take 1-2 tablets by mouth every 4 (four) hours as needed for moderate pain.   15 tablet   0   . metroNIDAZOLE (FLAGYL) 500 MG tablet   Oral   Take 1 tablet (500 mg total) by mouth 3 (three) times daily.   30 tablet   0   . naproxen (NAPROSYN) 500 MG tablet   Oral   Take 1 tablet (500 mg total) by mouth 2 (two) times daily with a meal.   60 tablet   0   . potassium chloride SA (K-DUR,KLOR-CON) 20 MEQ tablet   Oral   Take 1 tablet (20 mEq total) by mouth 2 (two) times daily.   10 tablet   0   . predniSONE (DELTASONE) 10 MG tablet   Oral   Take 5 tablets (50 mg total) by mouth daily with breakfast.   25 tablet   0   . promethazine (PHENERGAN) 25 MG tablet   Oral   Take 1 tablet (25 mg total) by mouth every 6 (six) hours as needed for nausea or vomiting.   15 tablet   0     Allergies Penicillins  No family history on file.  Social History Social History  Substance Use Topics  . Smoking status: Never Smoker   . Smokeless tobacco: None  . Alcohol Use: No    Review of Systems  Constitutional:  Negative for fever. Eyes: Negative for visual changes. ENT: Negative for sore throat. Cardiovascular: Negative for chest pain. Respiratory: Negative for shortness of breath. Gastrointestinal: Positive for right flank pain Genitourinary: Negative for dysuria. Musculoskeletal: Negative for back pain. Skin: Negative for rash. Neurological: Negative for headaches, focal weakness or numbness.   10-point ROS otherwise negative.  ____________________________________________   PHYSICAL EXAM:  VITAL SIGNS: ED Triage Vitals  Enc Vitals Group     BP 07/19/15 0421 138/67 mmHg     Pulse Rate 07/19/15 0421 60     Resp 07/19/15 0421 20     Temp 07/19/15 0421 97.9 F (36.6 C)     Temp Source 07/19/15 0421 Oral     SpO2 07/19/15 0421 99 %     Weight 07/19/15 0421 210 lb (95.255 kg)     Height 07/19/15 0421  (1.676 m)     Head Cir --      Peak Flow --      Pain Score 07/19/15 0419 10     Pain Loc --      Pain Edu? --  Excl. in GC? --      Constitutional: Alert and oriented. Apparent discomfort Eyes: Conjunctivae are normal. PERRL. Normal extraocular movements. ENT   Head: Normocephalic and atraumatic.   Nose: No congestion/rhinnorhea.   Mouth/Throat: Mucous membranes are moist.   Neck: No stridor. Hematological/Lymphatic/Immunilogical: No cervical lymphadenopathy. Cardiovascular: Normal rate, regular rhythm. Normal and symmetric distal pulses are present in all extremities. No murmurs, rubs, or gallops. Respiratory: Normal respiratory effort without tachypnea nor retractions. Breath sounds are clear and equal bilaterally. No wheezes/rales/rhonchi. Gastrointestinal: Right upper quadrant or lower quadrant right flank pain with gentle palpation No distention. There is no CVA tenderness. Genitourinary: deferred Musculoskeletal: Nontender with normal range of motion in all extremities. No joint effusions.  No lower extremity tenderness nor edema. Neurologic:  Normal  speech and language. No gross focal neurologic deficits are appreciated. Speech is normal.  Skin:  Skin is warm, dry and intact. No rash noted. Psychiatric: Mood and affect are normal. Speech and behavior are normal. Patient exhibits appropriate insight and judgment.  ____________________________________________    LABS (pertinent positives/negatives)  Labs Reviewed  CBC WITH DIFFERENTIAL/PLATELET - Abnormal; Notable for the following:    WBC 11.7 (*)    RBC 3.60 (*)    Hemoglobin 9.6 (*)    HCT 29.4 (*)    RDW 14.9 (*)    Platelets 444 (*)    Neutro Abs 9.3 (*)    All other components within normal limits  COMPREHENSIVE METABOLIC PANEL - Abnormal; Notable for the following:    BUN 41 (*)    Creatinine, Ser 1.03 (*)    Calcium 8.7 (*)    Albumin 3.2 (*)    GFR calc non Af Amer 54 (*)    All other components within normal limits  LIPASE, BLOOD  URINALYSIS COMPLETEWITH MICROSCOPIC ([facility] ONLY)       RADIOLOGY        US Abdomen Limited RUQ (Final result) Result time: 07/19/15 06:04:09   Final result by Rad Results In Interface (07/19/15 06:04:09)   Narrative:   CLINICAL DATA: Acute onset of right upper quadrant abdominal pain. Initial encounter.  EXAM: US ABDOMEN LIMITED - RIGHT UPPER QUADRANT  COMPARISON: CT of the abdomen and pelvis performed 10/25/2012  FINDINGS: Gallbladder:  No gallstones or wall thickening visualized. No sonographic Murphy sign noted by sonographer.  Common bile duct:  Diameter: 0.3 cm, within normal limits in caliber.  Liver:  No focal lesion identified. Within normal limits in parenchymal echogenicity.  IMPRESSION: Unremarkable ultrasound of the right upper quadrant.   Electronically Signed By: Roanna RaiderJeffery Chang M.D. On: 07/19/2015 06:04       INITIAL IMPRESSION / ASSESSMENT AND PLAN / ED COURSE  Pertinent labs & imaging results that were available during my care of the patient were reviewed by me and  considered in my medical decision making (see chart for details).    Patient's care transferred to Dr. Darnelle CatalanMalinda pending CT scan results  ____________________________________________   FINAL CLINICAL IMPRESSION(S) / ED DIAGNOSES  Final diagnoses:  Right upper quadrant pain      Darci Currentandolph N Brown, MD 07/19/15 [phone]

## 2015-07-19 NOTE — ED Notes (Signed)
Patient transported to X-ray 

## 2015-07-19 NOTE — ED Notes (Signed)
Pt to CT via Stretcher.

## 2015-07-19 NOTE — ED Notes (Addendum)
Report received.  Pt in US.

## 2015-07-19 NOTE — ED Notes (Addendum)
Pt c/o Right flank pain. Pt tearful and reports increased pain with movement.    07/19/15 0440  Musculoskeletal  Musculoskeletal (WDL) X

## 2015-07-19 NOTE — ED Notes (Signed)
Pt to triage via w/c, tearful; reports since 8pm having right flank/side pain with no accomp symptoms; denies hx of same

## 2015-07-19 NOTE — ED Notes (Signed)
Patient and family verbalized understanding of discharge instructions. All questions answered. Explained importance of FU and if PCP may want further testing but pt had CT, US, xray, labs, and urine here with no urgent causes of pts pain.

## 2015-07-19 NOTE — ED Provider Notes (Addendum)
CT scan repeat turns showing no acute pathology no explanation for the patient's right flank pain. Reexamination of the patient shows she is tender in the right CVA area there is no tenderness to light touch on the skin. Patient has some tenderness in right upper quadrant as well but no tenderness elsewhere in the abdomen. Patient's BUN is elevated her hematocrit and hemoglobin drop since her last visit however rectal exam is negative for Hemoccult. Denies any fever she denies any vomiting CT scan does show a small right pleural effusion and the patient says food seems to be getting stuck in her chest for the last couple days when she swallows solids. I will get a chest x-ray to see if that provides any explanation of the patient's symptoms. The meantime I will give her some more fluid some morphine IV and Bactrim in case she is developing a pyelonephritis she does have some white blood cells in the urine.  Arnaldo NatalPaul F Malinda, MD 07/19/15 [phone]  Chest x-ray returned essentially negative. I will have the patient follow-up with [facility] acute[facility]. They can't continue to follow her for the flank pain the developing trouble swallowing and the change in her hemoglobin and hematocrit and BUN.  Arnaldo NatalPaul F Malinda, MD 07/19/15 1022  Patient instructed to be careful not to fall or drive or operate hazardous machinery while using hydrocodone  Arnaldo NatalPaul F Malinda, MD 07/19/15 1023

## 2015-08-15 ENCOUNTER — Inpatient Hospital Stay: Payer: BLUE CROSS/BLUE SHIELD | Attending: Oncology | Admitting: Oncology

## 2015-08-15 ENCOUNTER — Inpatient Hospital Stay: Payer: BLUE CROSS/BLUE SHIELD

## 2015-08-15 VITALS — BP 150/78 | HR 82 | Temp 96.7°F | Resp 16 | Wt 163.4 lb

## 2015-08-15 DIAGNOSIS — K921 Melena: Secondary | ICD-10-CM | POA: Diagnosis not present

## 2015-08-15 DIAGNOSIS — R7989 Other specified abnormal findings of blood chemistry: Secondary | ICD-10-CM | POA: Diagnosis not present

## 2015-08-15 DIAGNOSIS — R5381 Other malaise: Secondary | ICD-10-CM | POA: Diagnosis not present

## 2015-08-15 DIAGNOSIS — Z79899 Other long term (current) drug therapy: Secondary | ICD-10-CM | POA: Insufficient documentation

## 2015-08-15 DIAGNOSIS — I10 Essential (primary) hypertension: Secondary | ICD-10-CM | POA: Diagnosis not present

## 2015-08-15 DIAGNOSIS — R5383 Other fatigue: Secondary | ICD-10-CM | POA: Diagnosis not present

## 2015-08-15 DIAGNOSIS — D649 Anemia, unspecified: Secondary | ICD-10-CM | POA: Insufficient documentation

## 2015-08-15 DIAGNOSIS — Z7952 Long term (current) use of systemic steroids: Secondary | ICD-10-CM | POA: Diagnosis not present

## 2015-08-15 DIAGNOSIS — Z90710 Acquired absence of both cervix and uterus: Secondary | ICD-10-CM | POA: Diagnosis not present

## 2015-08-15 DIAGNOSIS — R531 Weakness: Secondary | ICD-10-CM | POA: Insufficient documentation

## 2015-08-15 DIAGNOSIS — D539 Nutritional anemia, unspecified: Secondary | ICD-10-CM

## 2015-08-15 LAB — IRON AND TIBC
IRON: 31 ug/dL (ref 28–170)
Saturation Ratios: 12 % (ref 10.4–31.8)
TIBC: 245 ug/dL — ABNORMAL LOW (ref 250–450)
UIBC: 216 ug/dL

## 2015-08-15 LAB — CBC
HCT: 25.3 % — ABNORMAL LOW (ref 35.0–47.0)
Hemoglobin: 8.2 g/dL — ABNORMAL LOW (ref 12.0–16.0)
MCH: 26 pg (ref 26.0–34.0)
MCHC: 32.5 g/dL (ref 32.0–36.0)
MCV: 80.2 fL (ref 80.0–100.0)
PLATELETS: 291 10*3/uL (ref 150–440)
RBC: 3.15 MIL/uL — ABNORMAL LOW (ref 3.80–5.20)
RDW: 15.1 % — AB (ref 11.5–14.5)
WBC: 3.1 10*3/uL — ABNORMAL LOW (ref 3.6–11.0)

## 2015-08-15 LAB — RETICULOCYTES
RBC.: 3.15 MIL/uL — ABNORMAL LOW (ref 3.80–5.20)
Retic Count, Absolute: 37.8 10*3/uL (ref 19.0–183.0)
Retic Ct Pct: 1.2 % (ref 0.4–3.1)

## 2015-08-15 LAB — LACTATE DEHYDROGENASE: LDH: 260 U/L — AB (ref 98–192)

## 2015-08-15 LAB — DAT, POLYSPECIFIC AHG (ARMC ONLY): Polyspecific AHG test: NEGATIVE

## 2015-08-15 LAB — FOLATE: FOLATE: 10.7 ng/mL (ref >5.9)

## 2015-08-15 LAB — VITAMIN B12: VITAMIN B 12: 146 pg/mL — AB (ref 180–914)

## 2015-08-15 LAB — FERRITIN: FERRITIN: 3766 ng/mL — AB (ref 11–307)

## 2015-08-15 NOTE — Progress Notes (Signed)
Patient has history of anemia from many years ago.  She did notice bright red blood with bowel movement 3 days ago, never had a colonoscopy.  Feeling weakness is worsening

## 2015-08-17 ENCOUNTER — Ambulatory Visit: Payer: Medicare Other

## 2015-08-17 LAB — ERYTHROPOIETIN: Erythropoietin: 23 m[IU]/mL — ABNORMAL HIGH (ref 2.6–18.5)

## 2015-08-17 LAB — HEMOGLOBINOPATHY EVALUATION
HGB A: 97.7 % (ref 94.0–98.0)
Hgb A2 Quant: 2.3 % (ref 0.7–3.1)
Hgb C: 0 %
Hgb F Quant: 0 % (ref 0.0–2.0)
Hgb S Quant: 0 %

## 2015-08-17 LAB — HAPTOGLOBIN: Haptoglobin: 92 mg/dL (ref 34–200)

## 2015-08-18 NOTE — Progress Notes (Signed)
[facility]  Telephone:(336) [phone] Fax:(336) [phone]  ID: Amanda Lawrence OB: 1946-03-22  MR#: [id number]  EXB#:[id number]  No care team member to display  CHIEF COMPLAINT:  Chief Complaint  Patient presents with  . New Evaluation  . Anemia    INTERVAL HISTORY: Patient is a 70 year old female who was noted to have a declining hemoglobin on routine blood work. Patient feels like she is getting progressively weaker, but otherwise feels well. She also noted some bright red blood with her bowel movements several days ago. She has no neurologic complaints. She denies any recent fevers or illnesses. She has good appetite and denies weight loss. She denies any chest pain or shortness of breath. She denies any nausea, vomiting, constipation, or diarrhea. She has no urinary complaints. Patient otherwise feels well and offers no further specific complaints.  REVIEW OF SYSTEMS:   Review of Systems  Constitutional: Positive for malaise/fatigue. Negative for fever and weight loss.  Respiratory: Negative.  Negative for shortness of breath.   Cardiovascular: Negative.  Negative for chest pain.  Gastrointestinal: Positive for blood in stool. Negative for nausea, vomiting, abdominal pain, diarrhea, constipation and melena.  Musculoskeletal: Negative.   Neurological: Positive for weakness.    As per HPI. Otherwise, a complete review of systems is negatve.  PAST MEDICAL HISTORY: Past Medical History  Diagnosis Date  . Hypertension     PAST SURGICAL HISTORY: Past Surgical History  Procedure Laterality Date  . Abdominal hysterectomy      FAMILY HISTORY: Reviewed and unchanged. No reported history of malignancy or chronic disease.     ADVANCED DIRECTIVES:    HEALTH MAINTENANCE: Social History  Substance Use Topics  . Smoking status: Never Smoker   . Smokeless tobacco: Not on file  . Alcohol Use: No     Colonoscopy:  PAP:  Bone density:  Lipid  panel:  Allergies  Allergen Reactions  . Penicillins     Has patient had a PCN reaction causing immediate rash, facial/tongue/throat swelling, SOB or lightheadedness with hypotension: unsure Has patient had a PCN reaction causing severe rash involving mucus membranes or skin necrosis: unsure Has patient had a PCN reaction that required hospitalization: unsure Has patient had a PCN reaction occurring within the last 10 years: unsure If all of the above answers are "NO", then may proceed with Cephalosporin use.     Current Outpatient Prescriptions  Medication Sig Dispense Refill  . amLODipine (NORVASC) 5 MG tablet Take by mouth.    . ferrous sulfate 325 (65 FE) MG tablet Take by mouth.    . pantoprazole (PROTONIX) 40 MG tablet Take 40 mg by mouth.    . ciprofloxacin (CIPRO) 500 MG tablet Take 1 tablet (500 mg total) by mouth 2 (two) times daily. (Patient not taking: Reported on 08/15/2015) 14 tablet 0  . HYDROcodone-acetaminophen (NORCO) 5-325 MG tablet Take 1-2 tablets by mouth every 4 (four) hours as needed for moderate pain. (Patient not taking: Reported on 08/15/2015) 15 tablet 0  . HYDROcodone-acetaminophen (NORCO/VICODIN) 5-325 MG tablet Take 2 tablets by mouth every 6 (six) hours as needed for moderate pain. (Patient not taking: Reported on 08/15/2015) 20 tablet 0  . metroNIDAZOLE (FLAGYL) 500 MG tablet Take 1 tablet (500 mg total) by mouth 3 (three) times daily. (Patient not taking: Reported on 08/15/2015) 30 tablet 0  . naproxen (NAPROSYN) 500 MG tablet Take 1 tablet (500 mg total) by mouth 2 (two) times daily with a meal. (Patient not taking: Reported on  08/15/2015) 60 tablet 0  . potassium chloride SA (K-DUR,KLOR-CON) 20 MEQ tablet Take 1 tablet (20 mEq total) by mouth 2 (two) times daily. (Patient not taking: Reported on 08/15/2015) 10 tablet 0  . predniSONE (DELTASONE) 10 MG tablet Take 5 tablets (50 mg total) by mouth daily with breakfast. (Patient not taking: Reported on 08/15/2015) 25  tablet 0  . promethazine (PHENERGAN) 25 MG tablet Take 1 tablet (25 mg total) by mouth every 6 (six) hours as needed for nausea or vomiting. (Patient not taking: Reported on 08/15/2015) 15 tablet 0  . sulfamethoxazole-trimethoprim (BACTRIM DS,SEPTRA DS) 800-160 MG tablet Take 1 tablet by mouth 2 (two) times daily. (Patient not taking: Reported on 08/15/2015) 20 tablet 0   No current facility-administered medications for this visit.    OBJECTIVE: Filed Vitals:   08/15/15 1200  BP: 150/78  Pulse: 82  Temp: 96.7 F (35.9 C)  Resp: 16     Body mass index is 26.38 kg/(m^2).    ECOG FS:0 - Asymptomatic  General: Well-developed, well-nourished, no acute distress. Eyes: Pink conjunctiva, anicteric sclera. HEENT: Normocephalic, moist mucous membranes, clear oropharnyx. Lungs: Clear to auscultation bilaterally. Heart: Regular rate and rhythm. No rubs, murmurs, or gallops. Abdomen: Soft, nontender, nondistended. No organomegaly noted, normoactive bowel sounds. Musculoskeletal: No edema, cyanosis, or clubbing. Neuro: Alert, answering all questions appropriately. Cranial nerves grossly intact. Skin: No rashes or petechiae noted. Psych: Normal affect. Lymphatics: No cervical, calvicular, axillary or inguinal LAD.   LAB RESULTS:  Lab Results  Component Value Date   NA 141 07/19/2015   K 4.1 07/19/2015   CL 106 07/19/2015   CO2 28 07/19/2015   GLUCOSE 90 07/19/2015   BUN 41* 07/19/2015   CREATININE 1.03* 07/19/2015   CALCIUM 8.7* 07/19/2015   PROT 7.0 07/19/2015   ALBUMIN 3.2* 07/19/2015   AST 22 07/19/2015   ALT 16 07/19/2015   ALKPHOS 56 07/19/2015   BILITOT 0.5 07/19/2015   GFRNONAA 54* 07/19/2015   GFRAA >60 07/19/2015    Lab Results  Component Value Date   WBC 3.1* 08/15/2015   NEUTROABS 9.3* 07/19/2015   HGB 8.2* 08/15/2015   HCT 25.3* 08/15/2015   MCV 80.2 08/15/2015   PLT 291 08/15/2015     STUDIES: No results found.  ASSESSMENT: Anemia, elevated  ferritin.  PLAN:    1. Anemia: Patient's hemoglobin is 8.2. She has mildly decreased iron stores as well as a decreased B-12 level. She will benefit from both Feraheme and B-12 in the near future. The remainder of her laboratory work including hemoglobinopathy profile was either negative or within normal limits except for a significantly elevated ferritin level which I suspect is an acute phase reactant. Patient reports bright red blood in her stool recently and was given Hemoccult stool cards today. Have also recommend that she has a GI appointment for colonoscopy in the near future. Return to clinic in 2 weeks to discuss her laboratory results and treatment. Of note, patient had a normal CT scan of her abdomen and pelvis on July 19, 2015. 2. Elevated ferritin: Unclear etiology. Suspect acute phase reactant. 3. Hypertension: Blood pressure mildly elevated today. Continue current medications as prescribed.  Patient expressed understanding and was in agreement with this plan. She also understands that She can call clinic at any time with any questions, concerns, or complaints.    Jeralyn Ruths, MD   08/18/2015 1:25 PM

## 2015-08-20 ENCOUNTER — Other Ambulatory Visit: Payer: Self-pay | Admitting: *Deleted

## 2015-08-20 DIAGNOSIS — D539 Nutritional anemia, unspecified: Secondary | ICD-10-CM

## 2015-08-23 ENCOUNTER — Telehealth: Payer: Self-pay | Admitting: *Deleted

## 2015-08-23 NOTE — Telephone Encounter (Signed)
Called to inform patient of GI appointment with Dr. Bluford Kaufmann, appointment scheduled for 2/20 @ 10:30 am.

## 2015-08-28 ENCOUNTER — Other Ambulatory Visit: Payer: Self-pay | Admitting: *Deleted

## 2015-08-28 DIAGNOSIS — D539 Nutritional anemia, unspecified: Secondary | ICD-10-CM

## 2015-08-29 ENCOUNTER — Inpatient Hospital Stay: Payer: BLUE CROSS/BLUE SHIELD

## 2015-08-29 ENCOUNTER — Inpatient Hospital Stay (HOSPITAL_BASED_OUTPATIENT_CLINIC_OR_DEPARTMENT_OTHER): Payer: BLUE CROSS/BLUE SHIELD | Admitting: Oncology

## 2015-08-29 ENCOUNTER — Inpatient Hospital Stay: Payer: BLUE CROSS/BLUE SHIELD | Attending: Oncology

## 2015-08-29 VITALS — BP 177/73 | HR 68 | Temp 97.2°F | Resp 16 | Wt 163.1 lb

## 2015-08-29 DIAGNOSIS — R5383 Other fatigue: Secondary | ICD-10-CM | POA: Diagnosis not present

## 2015-08-29 DIAGNOSIS — R531 Weakness: Secondary | ICD-10-CM | POA: Diagnosis not present

## 2015-08-29 DIAGNOSIS — R5381 Other malaise: Secondary | ICD-10-CM

## 2015-08-29 DIAGNOSIS — Z90710 Acquired absence of both cervix and uterus: Secondary | ICD-10-CM

## 2015-08-29 DIAGNOSIS — I10 Essential (primary) hypertension: Secondary | ICD-10-CM | POA: Diagnosis not present

## 2015-08-29 DIAGNOSIS — R7989 Other specified abnormal findings of blood chemistry: Secondary | ICD-10-CM

## 2015-08-29 DIAGNOSIS — Z79899 Other long term (current) drug therapy: Secondary | ICD-10-CM | POA: Diagnosis not present

## 2015-08-29 DIAGNOSIS — D649 Anemia, unspecified: Secondary | ICD-10-CM | POA: Insufficient documentation

## 2015-08-29 DIAGNOSIS — D539 Nutritional anemia, unspecified: Secondary | ICD-10-CM

## 2015-08-29 LAB — CBC WITH DIFFERENTIAL/PLATELET
BASOS ABS: 0 10*3/uL (ref 0–0.1)
BASOS PCT: 0 %
EOS ABS: 0.1 10*3/uL (ref 0–0.7)
Eosinophils Relative: 2 %
HCT: 25 % — ABNORMAL LOW (ref 35.0–47.0)
HEMOGLOBIN: 8.1 g/dL — AB (ref 12.0–16.0)
Lymphocytes Relative: 33 %
Lymphs Abs: 0.9 10*3/uL — ABNORMAL LOW (ref 1.0–3.6)
MCH: 25.5 pg — ABNORMAL LOW (ref 26.0–34.0)
MCHC: 32.5 g/dL (ref 32.0–36.0)
MCV: 78.3 fL — ABNORMAL LOW (ref 80.0–100.0)
Monocytes Absolute: 0.3 10*3/uL (ref 0.2–0.9)
Monocytes Relative: 10 %
NEUTROS PCT: 55 %
Neutro Abs: 1.6 10*3/uL (ref 1.4–6.5)
PLATELETS: 208 10*3/uL (ref 150–440)
RBC: 3.2 MIL/uL — AB (ref 3.80–5.20)
RDW: 16 % — ABNORMAL HIGH (ref 11.5–14.5)
WBC: 2.9 10*3/uL — AB (ref 3.6–11.0)

## 2015-08-29 LAB — IRON AND TIBC
Iron: 24 ug/dL — ABNORMAL LOW (ref 28–170)
SATURATION RATIOS: 10 % — AB (ref 10.4–31.8)
TIBC: 247 ug/dL — AB (ref 250–450)
UIBC: 223 ug/dL

## 2015-08-29 LAB — FERRITIN: Ferritin: 1735 ng/mL — ABNORMAL HIGH (ref 11–307)

## 2015-08-29 MED ORDER — CYANOCOBALAMIN 1000 MCG/ML IJ SOLN
1000.0000 ug | INTRAMUSCULAR | Status: DC
  Administered 2015-08-29: 1000 ug via INTRAMUSCULAR
  Filled 2015-08-29: qty 1

## 2015-08-29 NOTE — Progress Notes (Signed)
[facility]  Telephone:(336) [phone] [phone]) [phone]  ID: VONCILLE SIMM Pettiford OB: 1945/12/23  MR#: [id number]  AOZ#:[id number]  [name], MD as PCP - General (Family Medicine) [name], MD as Physician Assistant (Internal Medicine)  CHIEF COMPLAINT:  Chief Complaint  Patient presents with  . Anemia    INTERVAL HISTORY: Patient returns to clinic today for further evaluation. She still feels weak and tired, but otherwise feels well. She has not noticed any more blood in her stool.  She has no neurologic complaints. She denies any recent fevers or illnesses. She has good appetite and denies weight loss. She denies any chest pain or shortness of breath. She denies any nausea, vomiting, constipation, or diarrhea. She has no urinary complaints. Patient offers no further specific complaints.  REVIEW OF SYSTEMS:   Review of Systems  Constitutional: Positive for malaise/fatigue. Negative for fever and weight loss.  Respiratory: Negative.  Negative for shortness of breath.   Cardiovascular: Negative.  Negative for chest pain.  Gastrointestinal: Negative.  Negative for blood in stool.  Musculoskeletal: Negative.   Neurological: Positive for weakness.    As per HPI. Otherwise, a complete review of systems is negatve.  PAST MEDICAL HISTORY: Past Medical History  Diagnosis Date  . Hypertension     PAST SURGICAL HISTORY: Past Surgical History  Procedure Laterality Date  . Abdominal hysterectomy      FAMILY HISTORY: Reviewed and unchanged. No reported history of malignancy or chronic disease.     ADVANCED DIRECTIVES:    HEALTH MAINTENANCE: Social History  Substance Use Topics  . Smoking status: Never Smoker   . Smokeless tobacco: Not on file  . Alcohol Use: No     Colonoscopy:  PAP:  Bone density:  Lipid panel:  Allergies  Allergen Reactions  . Penicillins     Has patient had a PCN reaction causing immediate  rash, facial/tongue/throat swelling, SOB or lightheadedness with hypotension: unsure Has patient had a PCN reaction causing severe rash involving mucus membranes or skin necrosis: unsure Has patient had a PCN reaction that required hospitalization: unsure Has patient had a PCN reaction occurring within the last 10 years: unsure If all of the above answers are "NO", then may proceed with Cephalosporin use.     Current Outpatient Prescriptions  Medication Sig Dispense Refill  . amLODipine (NORVASC) 5 MG tablet Take by mouth.    . ferrous sulfate 325 (65 FE) MG tablet Take by mouth.     No current facility-administered medications for this visit.    OBJECTIVE: Filed Vitals:   08/29/15 1452  BP: 177/73  Pulse: 68  Temp: 97.2 F (36.2 C)  Resp: 16     Body mass index is 26.34 kg/(m^2).    ECOG FS:0 - Asymptomatic  General: Well-developed, well-nourished, no acute distress. Eyes: Pink conjunctiva, anicteric sclera. Lungs: Clear to auscultation bilaterally. Heart: Regular rate and rhythm. No rubs, murmurs, or gallops. Abdomen: Soft, nontender, nondistended. No organomegaly noted, normoactive bowel sounds. Musculoskeletal: No edema, cyanosis, or clubbing. Neuro: Alert, answering all questions appropriately. Cranial nerves grossly intact. Skin: No rashes or petechiae noted. Psych: Normal affect.  LAB RESULTS:  Lab Results  Component Value Date   NA 141 07/19/2015   K 4.1 07/19/2015   CL 106 07/19/2015   CO2 28 07/19/2015   GLUCOSE 90 07/19/2015   BUN 41* 07/19/2015   CREATININE 1.03* 07/19/2015   CALCIUM 8.7* 07/19/2015   PROT 7.0 07/19/2015   ALBUMIN 3.2*  07/19/2015   AST 22 07/19/2015   ALT 16 07/19/2015   ALKPHOS 56 07/19/2015   BILITOT 0.5 07/19/2015   GFRNONAA 54* 07/19/2015   GFRAA >60 07/19/2015    Lab Results  Component Value Date   WBC 2.9* 08/29/2015   NEUTROABS 1.6 08/29/2015   HGB 8.1* 08/29/2015   HCT 25.0* 08/29/2015   MCV 78.3* 08/29/2015   PLT  208 08/29/2015     STUDIES: No results found.  ASSESSMENT: Anemia, elevated ferritin.  PLAN:    1. Anemia: Patient's hemoglobin is 8.1. She has mildly decreased iron stores as well as a decreased B-12 level. She will benefit from both Feraheme and B-12 in the near future. The remainder of her laboratory work including hemoglobinopathy profile was either negative or within normal limits except for a significantly elevated ferritin level which is possibly an acute phase reactant. Iron stores and Ferritin labs were drawn today and are still pending at this time. Patient has an appointment with GI on September 10, 2015 for a colonoscopy. Proceed today for B12 injection and then B12 injections monthly, returning evaluation and more laboratory work in 3 months. Of note, patient had a normal CT scan of her abdomen and pelvis on July 19, 2015. 2. Elevated ferritin: Unclear etiology. Suspect acute phase reactant. Todays results still pending.  3. Hypertension: Blood pressure mildly elevated today. Continue current medications as prescribed.  Patient expressed understanding and was in agreement with this plan. She also understands that She can call clinic at any time with any questions, concerns, or complaints.    Genevie Cheshire, NP   08/29/2015 3:35 PM   Patient was seen and evaluated independently and I agree with the assessment and plan as dictated above.  Jeralyn Ruths, MD 08/31/2015 1:07 PM

## 2015-09-26 ENCOUNTER — Inpatient Hospital Stay: Payer: BLUE CROSS/BLUE SHIELD | Attending: Oncology

## 2015-09-26 DIAGNOSIS — R5383 Other fatigue: Secondary | ICD-10-CM | POA: Diagnosis not present

## 2015-09-26 DIAGNOSIS — D539 Nutritional anemia, unspecified: Secondary | ICD-10-CM

## 2015-09-26 DIAGNOSIS — R531 Weakness: Secondary | ICD-10-CM | POA: Insufficient documentation

## 2015-09-26 DIAGNOSIS — R7989 Other specified abnormal findings of blood chemistry: Secondary | ICD-10-CM | POA: Diagnosis not present

## 2015-09-26 DIAGNOSIS — D649 Anemia, unspecified: Secondary | ICD-10-CM | POA: Insufficient documentation

## 2015-09-26 MED ORDER — CYANOCOBALAMIN 1000 MCG/ML IJ SOLN
1000.0000 ug | INTRAMUSCULAR | Status: DC
  Administered 2015-09-26: 1000 ug via INTRAMUSCULAR
  Filled 2015-09-26: qty 1

## 2015-10-05 ENCOUNTER — Encounter: Payer: Self-pay | Admitting: *Deleted

## 2015-10-08 ENCOUNTER — Encounter
Admission: RE | Disposition: A | Discharge status: Home / Self Care | Payer: Self-pay | Source: Ambulatory Visit | Attending: Gastroenterology

## 2015-10-08 ENCOUNTER — Encounter: Payer: Self-pay | Admitting: Anesthesiology

## 2015-10-08 ENCOUNTER — Ambulatory Visit: Payer: BLUE CROSS/BLUE SHIELD | Admitting: *Deleted

## 2015-10-08 ENCOUNTER — Ambulatory Visit
Admission: RE | Admit: 2015-10-08 | Discharge: 2015-10-08 | Disposition: A | Discharge status: Home / Self Care | Payer: BLUE CROSS/BLUE SHIELD | Source: Ambulatory Visit | Attending: Gastroenterology | Admitting: Gastroenterology

## 2015-10-08 DIAGNOSIS — Z79899 Other long term (current) drug therapy: Secondary | ICD-10-CM | POA: Diagnosis not present

## 2015-10-08 DIAGNOSIS — Z8249 Family history of ischemic heart disease and other diseases of the circulatory system: Secondary | ICD-10-CM | POA: Insufficient documentation

## 2015-10-08 DIAGNOSIS — Z1211 Encounter for screening for malignant neoplasm of colon: Secondary | ICD-10-CM | POA: Diagnosis present

## 2015-10-08 DIAGNOSIS — Z880 Allergy status to penicillin: Secondary | ICD-10-CM | POA: Diagnosis not present

## 2015-10-08 DIAGNOSIS — K5730 Diverticulosis of large intestine without perforation or abscess without bleeding: Secondary | ICD-10-CM | POA: Diagnosis not present

## 2015-10-08 DIAGNOSIS — D509 Iron deficiency anemia, unspecified: Secondary | ICD-10-CM | POA: Diagnosis not present

## 2015-10-08 DIAGNOSIS — Z809 Family history of malignant neoplasm, unspecified: Secondary | ICD-10-CM | POA: Diagnosis not present

## 2015-10-08 DIAGNOSIS — I10 Essential (primary) hypertension: Secondary | ICD-10-CM | POA: Diagnosis not present

## 2015-10-08 DIAGNOSIS — K219 Gastro-esophageal reflux disease without esophagitis: Secondary | ICD-10-CM | POA: Insufficient documentation

## 2015-10-08 HISTORY — PX: ESOPHAGOGASTRODUODENOSCOPY (EGD) WITH PROPOFOL: SHX5813

## 2015-10-08 HISTORY — DX: Anemia, unspecified: D64.9

## 2015-10-08 HISTORY — PX: COLONOSCOPY WITH PROPOFOL: SHX5780

## 2015-10-08 HISTORY — DX: Gastro-esophageal reflux disease without esophagitis: K21.9

## 2015-10-08 SURGERY — COLONOSCOPY WITH PROPOFOL
Anesthesia: General

## 2015-10-08 MED ORDER — PROPOFOL 500 MG/50ML IV EMUL
INTRAVENOUS | Status: DC | PRN
  Administered 2015-10-08: 100 ug/kg/min via INTRAVENOUS

## 2015-10-08 MED ORDER — SODIUM CHLORIDE 0.9 % IV SOLN
INTRAVENOUS | Status: DC
  Administered 2015-10-08: 11:00:00 via INTRAVENOUS

## 2015-10-08 MED ORDER — SODIUM CHLORIDE 0.9 % IV SOLN
INTRAVENOUS | Status: DC
  Administered 2015-10-08: 10:00:00 via INTRAVENOUS

## 2015-10-08 NOTE — Op Note (Signed)
[facility] Patient Name: Corliss MarcusDorothy Mcmillian Pettiford Procedure Date: 10/08/2015 10:13 AM MRN: [id number] Account #: [id number] Date of Birth: 03-Jun-1946 Admit Type: Outpatient Age: [age] [facility] 4 Gender: Female Note Status: Finalized Procedure:            Upper GI endoscopy Indications:          Iron deficiency anemia Providers:            Ezzard StandingPaul Y. Bluford Kaufmannh, MD Referring MD:         Marisue IvanKanhka Linthavong (Referring MD) Medicines:            Monitored Anesthesia Care Complications:        No immediate complications. Procedure:            Pre-Anesthesia Assessment:                       - Prior to the procedure, a History and Physical was                        performed, and patient medications, allergies and                        sensitivities were reviewed. The patient's tolerance of                        previous anesthesia was reviewed.                       - The risks and benefits of the procedure and the                        sedation options and risks were discussed with the                        patient. All questions were answered and informed                        consent was obtained.                       - After reviewing the risks and benefits, the patient                        was deemed in satisfactory condition to undergo the                        procedure.                       After obtaining informed consent, the endoscope was                        passed under direct vision. Throughout the procedure,                        the patient's blood pressure, pulse, and oxygen                        saturations were monitored continuously. The  Colonoscope was introduced through the mouth, and                        advanced to the second part of duodenum. The upper GI                        endoscopy was accomplished without difficulty. The                        patient tolerated the procedure  well. Findings:      The examined esophagus was normal.      The entire examined stomach was normal.      The examined duodenum was normal. Impression:           - Normal esophagus.                       - Normal stomach.                       - Normal examined duodenum.                       - No specimens collected. Recommendation:       - Discharge patient to home.                       - Observe patient's clinical course.                       - The findings and recommendations were discussed with                        the patient. Procedure Code(s):    --- Professional ---                       [phone], Esophagogastroduodenoscopy, flexible, transoral;                        diagnostic, including collection of specimen(s) by                        brushing or washing, when performed (separate procedure) Diagnosis Code(s):    --- Professional ---                       D50.9, Iron deficiency anemia, unspecified CPT copyright 2016 American Medical Association. All rights reserved. The codes documented in this report are preliminary and upon coder review may  be revised to meet current compliance requirements. [name], MD 10/08/2015 10:43:24 AM This report has been signed electronically. Number of Addenda: 0 Note Initiated On: 10/08/2015 10:13 AM      [facility]

## 2015-10-08 NOTE — H&P (Signed)
    Primary Care Physician:  Marisue IvanLINTHAVONG, KANHKA, MD Primary Gastroenterologist:  Dr. Bluford Kaufmannh  Pre-Procedure History & Physical: HPI:  Amanda Lawrence is a 70 y.o. female is here for an  EGD/colonoscopy  Past Medical History  Diagnosis Date  . Hypertension   . GERD (gastroesophageal reflux disease)   . Anemia     Past Surgical History  Procedure Laterality Date  . Abdominal hysterectomy      Prior to Admission medications   Medication Sig Start Date End Date Taking? Authorizing Provider  amLODipine (NORVASC) 5 MG tablet Take by mouth. 08/08/15 08/07/16 Yes Historical Provider, MD  meloxicam (MOBIC) 15 MG tablet Take 15 mg by mouth daily.   Yes Historical Provider, MD  ferrous sulfate 325 (65 FE) MG tablet Take by mouth.    Historical Provider, MD    Allergies as of 09/26/2015 - Review Complete 08/29/2015  Allergen Reaction Noted  . Penicillins  07/05/2015    History reviewed. No pertinent family history.  Social History   Social History  . Marital Status: Widowed    Spouse Name: N/A  . Number of Children: N/A  . Years of Education: N/A   Occupational History  . Not on file.   Social History Main Topics  . Smoking status: Never Smoker   . Smokeless tobacco: Not on file  . Alcohol Use: No  . Drug Use: Not on file  . Sexual Activity: Not on file   Other Topics Concern  . Not on file   Social History Narrative    Review of Systems: See HPI, otherwise negative ROS  Physical Exam: There were no vitals taken for this visit. General:   Alert,  pleasant and cooperative in NAD Head:  Normocephalic and atraumatic. Neck:  Supple; no masses or thyromegaly. Lungs:  Clear throughout to auscultation.    Heart:  Regular rate and rhythm. Abdomen:  Soft, nontender and nondistended. Normal bowel sounds, without guarding, and without rebound.   Neurologic:  Alert and  oriented x4;  grossly normal neurologically.  Impression/Plan: Amanda Lawrence  is here for an EGD/colonoscopy to be performed for screening, fe def anemia Risks, benefits, limitations, and alternatives regarding EGD/colonoscopy have been reviewed with the patient.  Questions have been answered.  All parties agreeable.   Bethani Brugger, Ezzard StandingPAUL Y, MD  10/08/2015, 10:19 AM

## 2015-10-08 NOTE — Op Note (Signed)
[facility]enter[facility] Patient Name: Amanda Lawrence Procedure Date: 10/08/2015 10:15 AM MRN: [id number] Account #: [id number] Date of Birth: 12-07-1945 Admit Type: Outpatient Age: 70 Room: [facility] ENDO ROOM 4 Gender: Female Note Status: Finalized Procedure:            Colonoscopy Indications:          Screening for colorectal malignant neoplasm Providers:            Ezzard Standing. Bluford Kaufmann, MD Referring MD:         Marisue Ivan (Referring MD) Medicines:            Monitored Anesthesia Care Complications:        No immediate complications. Procedure:            Pre-Anesthesia Assessment:                       - Prior to the procedure, a History and Physical was                        performed, and patient medications, allergies and                        sensitivities were reviewed. The patient's tolerance of                        previous anesthesia was reviewed.                       - The risks and benefits of the procedure and the                        sedation options and risks were discussed with the                        patient. All questions were answered and informed                        consent was obtained.                       - After reviewing the risks and benefits, the patient                        was deemed in satisfactory condition to undergo the                        procedure.                       After obtaining informed consent, the colonoscope was                        passed under direct vision. Throughout the procedure,                        the patient's blood pressure, pulse, and oxygen                        saturations were monitored continuously. The  Colonoscope was introduced through the anus and                        advanced to the the cecum, identified by appendiceal                        orifice and ileocecal valve. The colonoscopy was                        performed without  difficulty. The patient tolerated the                        procedure well. The quality of the bowel preparation                        was good. Findings:      Multiple small and large-mouthed diverticula were found in the sigmoid       colon.      Either scope trauma vs proctitis in rectum. Biopsies were taken with a       cold forceps for histology.      The exam was otherwise without abnormality. Impression:           - Diverticulosis in the sigmoid colon.                       - The examination was otherwise normal. Recommendation:       - Discharge patient to home.                       - Await pathology results.                       - The findings and recommendations were discussed with                        the patient. Procedure Code(s):    --- Professional ---                       [id number], Colonoscopy, flexible; with biopsy, single or                        multiple Diagnosis Code(s):    --- Professional ---                       Z12.11, Encounter for screening for malignant neoplasm                        of colon                       K57.30, Diverticulosis of large intestine without                        perforation or abscess without bleeding CPT copyright 2016 American Medical Association. All rights reserved. The codes documented in this report are preliminary and upon coder review may  be revised to meet current compliance requirements. Wallace CullensPaul Y Maja Mccaffery, MD 10/08/2015 10:58:30 AM This report has been signed electronically. Number of Addenda: 0 Note Initiated On: 10/08/2015 10:15 AM Scope Withdrawal Time: 0 hours 5 minutes 26 seconds  Total Procedure Duration: 0  hours 10 minutes 2 seconds       [facility]

## 2015-10-08 NOTE — Anesthesia Preprocedure Evaluation (Signed)
Anesthesia Evaluation  Patient identified by MRN, date of birth, ID band Patient awake    Reviewed: Allergy & Precautions, H&P , NPO status , Patient's Chart, lab work & pertinent test results, reviewed documented beta blocker date and time   History of Anesthesia Complications Negative for: history of anesthetic complications  Airway Mallampati: II  TM Distance: >3 FB Neck ROM: full    Dental no notable dental hx. (+) Edentulous Upper, Edentulous Lower, Upper Dentures, Lower Dentures   Pulmonary neg pulmonary ROS,    Pulmonary exam normal breath sounds clear to auscultation       Cardiovascular Exercise Tolerance: Good hypertension, On Medications (-) angina(-) CAD, (-) Past MI, (-) Cardiac Stents and (-) CABG Normal cardiovascular exam(-) dysrhythmias (-) Valvular Problems/Murmurs Rhythm:regular Rate:Normal     Neuro/Psych negative neurological ROS  negative psych ROS   GI/Hepatic Neg liver ROS, GERD  ,  Endo/Other  negative endocrine ROS  Renal/GU negative Renal ROS  negative genitourinary   Musculoskeletal   Abdominal   Peds  Hematology  (+) Blood dyscrasia, anemia ,   Anesthesia Other Findings Past Medical History:   Hypertension                                                 GERD (gastroesophageal reflux disease)                       Anemia                                                       Reproductive/Obstetrics negative OB ROS                             Anesthesia Physical Anesthesia Plan  ASA: II  Anesthesia Plan: General   Post-op Pain Management:    Induction:   Airway Management Planned:   Additional Equipment:   Intra-op Plan:   Post-operative Plan:   Informed Consent: I have reviewed the patients History and Physical, chart, labs and discussed the procedure including the risks, benefits and alternatives for the proposed anesthesia with the patient or  authorized representative who has indicated his/her understanding and acceptance.   Dental Advisory Given  Plan Discussed with: Anesthesiologist, CRNA and Surgeon  Anesthesia Plan Comments:         Anesthesia Quick Evaluation

## 2015-10-08 NOTE — Transfer of Care (Signed)
Immediate Anesthesia Transfer of Care Note  Patient: Amanda Lawrence  Procedure(s) Performed: Procedure(s): COLONOSCOPY WITH PROPOFOL (N/A) ESOPHAGOGASTRODUODENOSCOPY (EGD) WITH PROPOFOL (N/A)  Patient Location: PACU  Anesthesia Type:General  Level of Consciousness: awake, alert  and oriented  Airway & Oxygen Therapy: Patient Spontanous Breathing and Patient connected to nasal cannula oxygen  Post-op Assessment: Report given to RN and Post -op Vital signs reviewed and stable  Post vital signs: Reviewed and stable  Last Vitals:  Filed Vitals:   10/08/15 1020 10/08/15 1103  BP: 175/62 135/42  Pulse: 61 47  Temp: 35.7 C   Resp: 17 19    Complications: No apparent anesthesia complications and Patient re-intubated

## 2015-10-08 NOTE — Anesthesia Postprocedure Evaluation (Signed)
Anesthesia Post Note  Patient: Amanda SpatesDorothy L McMillian Pettiford  Procedure(s) Performed: Procedure(s) (LRB): COLONOSCOPY WITH PROPOFOL (N/A) ESOPHAGOGASTRODUODENOSCOPY (EGD) WITH PROPOFOL (N/A)  Patient location during evaluation: Endoscopy Anesthesia Type: General Level of consciousness: awake and alert Pain management: pain level controlled Vital Signs Assessment: post-procedure vital signs reviewed and stable Respiratory status: spontaneous breathing, nonlabored ventilation, respiratory function stable and patient connected to nasal cannula oxygen Cardiovascular status: blood pressure returned to baseline and stable Postop Assessment: no signs of nausea or vomiting Anesthetic complications: no    Last Vitals:  Filed Vitals:   10/08/15 1130 10/08/15 1140  BP: 162/48 163/71  Pulse: 43   Temp:    Resp: 10     Last Pain: There were no vitals filed for this visit.               Lenard SimmerAndrew Amalia [place]

## 2015-10-09 LAB — SURGICAL PATHOLOGY

## 2015-10-10 ENCOUNTER — Encounter: Payer: Self-pay | Admitting: Gastroenterology

## 2015-10-24 ENCOUNTER — Inpatient Hospital Stay: Payer: BLUE CROSS/BLUE SHIELD | Attending: Oncology

## 2015-10-24 DIAGNOSIS — D649 Anemia, unspecified: Secondary | ICD-10-CM | POA: Diagnosis not present

## 2015-10-24 DIAGNOSIS — D539 Nutritional anemia, unspecified: Secondary | ICD-10-CM

## 2015-10-24 DIAGNOSIS — R7989 Other specified abnormal findings of blood chemistry: Secondary | ICD-10-CM | POA: Diagnosis not present

## 2015-10-24 MED ORDER — CYANOCOBALAMIN 1000 MCG/ML IJ SOLN
1000.0000 ug | Freq: Once | INTRAMUSCULAR | Status: AC
  Administered 2015-10-24: 1000 ug via INTRAMUSCULAR
  Filled 2015-10-24: qty 1

## 2015-10-24 MED ORDER — CYANOCOBALAMIN 1000 MCG/ML IJ SOLN: 1000.0000 ug | INTRAMUSCULAR | Status: DC

## 2015-11-28 ENCOUNTER — Ambulatory Visit: Payer: Medicare Other

## 2015-11-28 ENCOUNTER — Other Ambulatory Visit: Payer: Medicare Other

## 2015-11-28 ENCOUNTER — Ambulatory Visit: Payer: Medicare Other | Admitting: Oncology

## 2015-12-05 ENCOUNTER — Inpatient Hospital Stay (HOSPITAL_BASED_OUTPATIENT_CLINIC_OR_DEPARTMENT_OTHER): Payer: BLUE CROSS/BLUE SHIELD | Admitting: Oncology

## 2015-12-05 ENCOUNTER — Inpatient Hospital Stay: Payer: BLUE CROSS/BLUE SHIELD | Attending: Oncology

## 2015-12-05 ENCOUNTER — Inpatient Hospital Stay: Payer: BLUE CROSS/BLUE SHIELD

## 2015-12-05 ENCOUNTER — Other Ambulatory Visit: Payer: Self-pay | Admitting: *Deleted

## 2015-12-05 VITALS — BP 192/76 | HR 56 | Temp 97.0°F | Resp 16 | Wt 165.6 lb

## 2015-12-05 DIAGNOSIS — K219 Gastro-esophageal reflux disease without esophagitis: Secondary | ICD-10-CM

## 2015-12-05 DIAGNOSIS — I10 Essential (primary) hypertension: Secondary | ICD-10-CM | POA: Diagnosis not present

## 2015-12-05 DIAGNOSIS — D509 Iron deficiency anemia, unspecified: Secondary | ICD-10-CM

## 2015-12-05 DIAGNOSIS — M25569 Pain in unspecified knee: Secondary | ICD-10-CM

## 2015-12-05 DIAGNOSIS — D539 Nutritional anemia, unspecified: Secondary | ICD-10-CM

## 2015-12-05 DIAGNOSIS — R5383 Other fatigue: Secondary | ICD-10-CM | POA: Diagnosis not present

## 2015-12-05 DIAGNOSIS — R531 Weakness: Secondary | ICD-10-CM | POA: Insufficient documentation

## 2015-12-05 DIAGNOSIS — Z79899 Other long term (current) drug therapy: Secondary | ICD-10-CM | POA: Insufficient documentation

## 2015-12-05 LAB — CBC WITH DIFFERENTIAL/PLATELET
BASOS ABS: 0 10*3/uL (ref 0–0.1)
BASOS PCT: 0 %
EOS ABS: 0 10*3/uL (ref 0–0.7)
EOS PCT: 1 %
HEMATOCRIT: 31.2 % — AB (ref 35.0–47.0)
Hemoglobin: 10.2 g/dL — ABNORMAL LOW (ref 12.0–16.0)
Lymphocytes Relative: 33 %
Lymphs Abs: 1.5 10*3/uL (ref 1.0–3.6)
MCH: 24.5 pg — ABNORMAL LOW (ref 26.0–34.0)
MCHC: 32.6 g/dL (ref 32.0–36.0)
MCV: 75.1 fL — ABNORMAL LOW (ref 80.0–100.0)
MONO ABS: 0.3 10*3/uL (ref 0.2–0.9)
MONOS PCT: 8 %
Neutro Abs: 2.5 10*3/uL (ref 1.4–6.5)
Neutrophils Relative %: 58 %
PLATELETS: 188 10*3/uL (ref 150–440)
RBC: 4.15 MIL/uL (ref 3.80–5.20)
RDW: 17.4 % — AB (ref 11.5–14.5)
WBC: 4.4 10*3/uL (ref 3.6–11.0)

## 2015-12-05 LAB — IRON AND TIBC
Iron: 30 ug/dL (ref 28–170)
Saturation Ratios: 10 % — ABNORMAL LOW (ref 10.4–31.8)
TIBC: 299 ug/dL (ref 250–450)
UIBC: 269 ug/dL

## 2015-12-05 LAB — FERRITIN: Ferritin: 293 ng/mL (ref 11–307)

## 2015-12-05 MED ORDER — CYANOCOBALAMIN 1000 MCG/ML IJ SOLN
1000.0000 ug | Freq: Once | INTRAMUSCULAR | Status: AC
  Administered 2015-12-05: 1000 ug via INTRAMUSCULAR
  Filled 2015-12-05: qty 1

## 2015-12-05 MED ORDER — FERUMOXYTOL INJECTION 510 MG/17 ML
510.0000 mg | Freq: Once | INTRAVENOUS | Status: AC
  Administered 2015-12-05: 510 mg via INTRAVENOUS
  Filled 2015-12-05: qty 17

## 2015-12-05 NOTE — Progress Notes (Signed)
Patient is feeling fatigued.  Blood pressure is elevated today at 192/76 and she says it is due to increasing knee pain.

## 2015-12-10 NOTE — Progress Notes (Signed)
[facility]  [phone] [phone]  ID: Amanda Lawrence OB: 12/26/1945  MR#: [id number]  EXB#:[id number]  [name], MD as PCP - General (Family Medicine) [name], MD as Physician Assistant (Internal Medicine)  CHIEF COMPLAINT:  Chief Complaint  Patient presents with  . Anemia    INTERVAL HISTORY: Patient returns to clinic today for Repeat laboratory work, further evaluation, and consideration of IV iron and B-12 injection. She continues to feel persistently weak and fatigued. Her blood pressure is also significantly elevated, which she attributes to knee pain. She has not noticed any more blood in her stool.  She has no neurologic complaints. She denies any recent fevers or illnesses. She has good appetite and denies weight loss. She denies any chest pain or shortness of breath. She denies any nausea, vomiting, constipation, or diarrhea. She has no urinary complaints. Patient offers no further specific complaints.  REVIEW OF SYSTEMS:   Review of Systems  Constitutional: Positive for malaise/fatigue. Negative for fever and weight loss.  Respiratory: Negative.  Negative for shortness of breath.   Cardiovascular: Negative.  Negative for chest pain.  Gastrointestinal: Negative.  Negative for blood in stool and melena.  Musculoskeletal: Positive for joint pain.  Neurological: Negative.   Psychiatric/Behavioral: Negative.     As per HPI. Otherwise, a complete review of systems is negatve.  PAST MEDICAL HISTORY: Past Medical History  Diagnosis Date  . Hypertension   . GERD (gastroesophageal reflux disease)   . Anemia     PAST SURGICAL HISTORY: Past Surgical History  Procedure Laterality Date  . Abdominal hysterectomy    . Colonoscopy with propofol N/A 10/08/2015    Procedure: COLONOSCOPY WITH PROPOFOL;  Surgeon: [name], MD;  [facility] ENDOSCOPY;  Service: Gastroenterology;  Laterality: N/A;   . Esophagogastroduodenoscopy (egd) with propofol N/A 10/08/2015    Procedure: ESOPHAGOGASTRODUODENOSCOPY (EGD) WITH PROPOFOL;  Surgeon: [name], MD;  Location: [facility] ENDOSCOPY;  Service: Gastroenterology;  Laterality: N/A;    FAMILY HISTORY: Reviewed and unchanged. No reported history of malignancy or chronic disease.     ADVANCED DIRECTIVES:    HEALTH MAINTENANCE: Social History  Substance Use Topics  . Smoking status: Never Smoker   . Smokeless tobacco: Not on file  . Alcohol Use: No     Colonoscopy:  PAP:  Bone density:  Lipid panel:  Allergies  Allergen Reactions  . Penicillins     Has patient had a PCN reaction causing immediate rash, facial/tongue/throat swelling, SOB or lightheadedness with hypotension: unsure Has patient had a PCN reaction causing severe rash involving mucus membranes or skin necrosis: unsure Has patient had a PCN reaction that required hospitalization: unsure Has patient had a PCN reaction occurring within the last 10 years: unsure If all of the above answers are "NO", then may proceed with Cephalosporin use.     Current Outpatient Prescriptions  Medication Sig Dispense Refill  . acetaminophen (TYLENOL) 500 MG tablet Take by mouth.    Marland Kitchen amLODipine (NORVASC) 5 MG tablet Take by mouth.    . ferrous sulfate 325 (65 FE) MG tablet Take by mouth.    . pantoprazole (PROTONIX) 20 MG tablet Take by mouth.     No current facility-administered medications for this visit.    OBJECTIVE: Filed Vitals:   12/05/15 1416  BP: 192/76  Pulse: 56  Temp: 97 F (36.1 C)  Resp: 16     Body mass index is 26.74 kg/(m^2).  ECOG FS:0 - Asymptomatic  General: Well-developed, well-nourished, no acute distress. Eyes: Pink conjunctiva, anicteric sclera. Lungs: Clear to auscultation bilaterally. Heart: Regular rate and rhythm. No rubs, murmurs, or gallops. Abdomen: Soft, nontender, nondistended. No organomegaly noted, normoactive bowel sounds. Musculoskeletal:  No edema, cyanosis, or clubbing. Neuro: Alert, answering all questions appropriately. Cranial nerves grossly intact. Skin: No rashes or petechiae noted. Psych: Normal affect.  LAB RESULTS:  Lab Results  Component Value Date   NA 141 07/19/2015   K 4.1 07/19/2015   CL 106 07/19/2015   CO2 28 07/19/2015   GLUCOSE 90 07/19/2015   BUN 41* 07/19/2015   CREATININE 1.03* 07/19/2015   CALCIUM 8.7* 07/19/2015   PROT 7.0 07/19/2015   ALBUMIN 3.2* 07/19/2015   AST 22 07/19/2015   ALT 16 07/19/2015   ALKPHOS 56 07/19/2015   BILITOT 0.5 07/19/2015   GFRNONAA 54* 07/19/2015   GFRAA >60 07/19/2015    Lab Results  Component Value Date   WBC 4.4 12/05/2015   NEUTROABS 2.5 12/05/2015   HGB 10.2* 12/05/2015   HCT 31.2* 12/05/2015   MCV 75.1* 12/05/2015   PLT 188 12/05/2015   Lab Results  Component Value Date   IRON 30 12/05/2015   TIBC 299 12/05/2015   IRONPCTSAT 10* 12/05/2015    Lab Results  Component Value Date   FERRITIN 293 12/05/2015     STUDIES: No results found.  ASSESSMENT: Anemia, elevated ferritin.  PLAN:    1. Anemia: Patient's hemoglobin has improved, but is still decreased to 10.2. Her iron saturation also continues to be decreased. Proceed with 510 mg IV Feraheme today as well as B-12 injection. Return to clinic in 1 week to receive a second infusion of Feraheme. Patient will then return to clinic in 3 months with repeat laboratory work and further evaluation. The remainder of her laboratory work including hemoglobinopathy profile was either negative or within normal limits  colonoscopy and EGD on September 10, 2015 were unrevealing. Of note, patient had a normal CT scan of her abdomen and pelvis on July 19, 2015. 2. Elevated ferritin: Resolved. Likely previously elevated as an acute phase reactant.  3. Hypertension: Blood pressure significantly elevated today. Continue current medications as prescribed.  Patient expressed understanding and was in agreement  with this plan. She also understands that She can call clinic at any time with any questions, concerns, or complaints.    [name], MD   12/10/2015 9:52 AM

## 2015-12-11 ENCOUNTER — Inpatient Hospital Stay: Payer: BLUE CROSS/BLUE SHIELD

## 2015-12-11 ENCOUNTER — Other Ambulatory Visit: Payer: Self-pay | Admitting: Family Medicine

## 2015-12-11 VITALS — BP 154/79 | HR 52 | Temp 96.3°F | Resp 18

## 2015-12-11 DIAGNOSIS — D509 Iron deficiency anemia, unspecified: Secondary | ICD-10-CM | POA: Diagnosis not present

## 2015-12-11 DIAGNOSIS — D539 Nutritional anemia, unspecified: Secondary | ICD-10-CM

## 2015-12-11 DIAGNOSIS — Z1231 Encounter for screening mammogram for malignant neoplasm of breast: Secondary | ICD-10-CM

## 2015-12-11 MED ORDER — SODIUM CHLORIDE 0.9 % IV SOLN
510.0000 mg | Freq: Once | INTRAVENOUS | Status: AC
  Administered 2015-12-11: 510 mg via INTRAVENOUS
  Filled 2015-12-11: qty 17

## 2015-12-11 MED ORDER — SODIUM CHLORIDE 0.9 % IV SOLN
Freq: Once | INTRAVENOUS | Status: AC
  Administered 2015-12-11: 11:00:00 via INTRAVENOUS
  Filled 2015-12-11: qty 1000

## 2015-12-12 ENCOUNTER — Ambulatory Visit: Payer: Medicare Other

## 2016-01-02 ENCOUNTER — Ambulatory Visit: Payer: Medicare Other | Attending: Family Medicine

## 2016-01-14 ENCOUNTER — Observation Stay: Payer: BLUE CROSS/BLUE SHIELD

## 2016-01-14 ENCOUNTER — Other Ambulatory Visit: Payer: Self-pay

## 2016-01-14 ENCOUNTER — Observation Stay
Admission: EM | Admit: 2016-01-14 | Discharge: 2016-01-16 | Disposition: A | Discharge status: Home / Self Care | Payer: BLUE CROSS/BLUE SHIELD | Attending: Internal Medicine | Admitting: Internal Medicine

## 2016-01-14 ENCOUNTER — Encounter: Payer: Self-pay | Admitting: Emergency Medicine

## 2016-01-14 DIAGNOSIS — I10 Essential (primary) hypertension: Secondary | ICD-10-CM | POA: Diagnosis not present

## 2016-01-14 DIAGNOSIS — Z9889 Other specified postprocedural states: Secondary | ICD-10-CM | POA: Diagnosis not present

## 2016-01-14 DIAGNOSIS — I160 Hypertensive urgency: Secondary | ICD-10-CM | POA: Diagnosis present

## 2016-01-14 DIAGNOSIS — N83202 Unspecified ovarian cyst, left side: Secondary | ICD-10-CM | POA: Insufficient documentation

## 2016-01-14 DIAGNOSIS — M47812 Spondylosis without myelopathy or radiculopathy, cervical region: Secondary | ICD-10-CM | POA: Insufficient documentation

## 2016-01-14 DIAGNOSIS — Z880 Allergy status to penicillin: Secondary | ICD-10-CM | POA: Insufficient documentation

## 2016-01-14 DIAGNOSIS — M542 Cervicalgia: Secondary | ICD-10-CM | POA: Diagnosis present

## 2016-01-14 DIAGNOSIS — Z79899 Other long term (current) drug therapy: Secondary | ICD-10-CM | POA: Insufficient documentation

## 2016-01-14 DIAGNOSIS — Z90710 Acquired absence of both cervix and uterus: Secondary | ICD-10-CM | POA: Diagnosis not present

## 2016-01-14 DIAGNOSIS — R55 Syncope and collapse: Secondary | ICD-10-CM | POA: Diagnosis present

## 2016-01-14 DIAGNOSIS — M25512 Pain in left shoulder: Secondary | ICD-10-CM | POA: Insufficient documentation

## 2016-01-14 DIAGNOSIS — J0100 Acute maxillary sinusitis, unspecified: Secondary | ICD-10-CM | POA: Diagnosis not present

## 2016-01-14 DIAGNOSIS — R531 Weakness: Secondary | ICD-10-CM | POA: Diagnosis not present

## 2016-01-14 DIAGNOSIS — J320 Chronic maxillary sinusitis: Secondary | ICD-10-CM | POA: Diagnosis not present

## 2016-01-14 DIAGNOSIS — E876 Hypokalemia: Secondary | ICD-10-CM | POA: Diagnosis not present

## 2016-01-14 DIAGNOSIS — R29898 Other symptoms and signs involving the musculoskeletal system: Secondary | ICD-10-CM

## 2016-01-14 DIAGNOSIS — R413 Other amnesia: Secondary | ICD-10-CM | POA: Insufficient documentation

## 2016-01-14 DIAGNOSIS — I739 Peripheral vascular disease, unspecified: Secondary | ICD-10-CM | POA: Diagnosis not present

## 2016-01-14 DIAGNOSIS — R001 Bradycardia, unspecified: Secondary | ICD-10-CM | POA: Diagnosis not present

## 2016-01-14 DIAGNOSIS — K219 Gastro-esophageal reflux disease without esophagitis: Secondary | ICD-10-CM | POA: Insufficient documentation

## 2016-01-14 DIAGNOSIS — R0602 Shortness of breath: Secondary | ICD-10-CM

## 2016-01-14 LAB — BASIC METABOLIC PANEL
Anion gap: 7 (ref 5–15)
BUN: 13 mg/dL (ref 6–20)
CO2: 28 mmol/L (ref 22–32)
CREATININE: 0.56 mg/dL (ref 0.44–1.00)
Calcium: 9.2 mg/dL (ref 8.9–10.3)
Chloride: 105 mmol/L (ref 101–111)
GFR calc Af Amer: >60 mL/min (ref >60)
GFR calc non Af Amer: >60 mL/min (ref >60)
Glucose, Bld: 100 mg/dL — ABNORMAL HIGH (ref 65–99)
Potassium: 3.4 mmol/L — ABNORMAL LOW (ref 3.5–5.1)
Sodium: 140 mmol/L (ref 135–145)

## 2016-01-14 LAB — CBC
HCT: 35.2 % (ref 35.0–47.0)
Hemoglobin: 11.9 g/dL — ABNORMAL LOW (ref 12.0–16.0)
MCH: 26.9 pg (ref 26.0–34.0)
MCHC: 33.8 g/dL (ref 32.0–36.0)
MCV: 79.8 fL — AB (ref 80.0–100.0)
PLATELETS: 170 10*3/uL (ref 150–440)
RBC: 4.41 MIL/uL (ref 3.80–5.20)
RDW: 20.3 % — AB (ref 11.5–14.5)
WBC: 5.1 10*3/uL (ref 3.6–11.0)

## 2016-01-14 LAB — TROPONIN I
TROPONIN I: 0.04 ng/mL — AB (ref <0.031)
TROPONIN I: 0.06 ng/mL — AB (ref <0.031)

## 2016-01-14 LAB — FIBRIN DERIVATIVES D-DIMER (ARMC ONLY): FIBRIN DERIVATIVES D-DIMER (ARMC): 290 (ref 0–499)

## 2016-01-14 MED ORDER — ACETAMINOPHEN 650 MG RE SUPP: 650.0000 mg | Freq: Four times a day (QID) | RECTAL | Status: DC | PRN

## 2016-01-14 MED ORDER — ASPIRIN EC 81 MG PO TBEC
DELAYED_RELEASE_TABLET | ORAL | Status: AC
  Filled 2016-01-14: qty 1

## 2016-01-14 MED ORDER — HYDROCODONE-ACETAMINOPHEN 5-325 MG PO TABS
1.0000 | ORAL_TABLET | ORAL | Status: DC | PRN
  Administered 2016-01-14: 2 via ORAL
  Filled 2016-01-14: qty 2

## 2016-01-14 MED ORDER — HYDRALAZINE HCL 20 MG/ML IJ SOLN
10.0000 mg | INTRAMUSCULAR | Status: AC
  Administered 2016-01-14: 10 mg via INTRAVENOUS

## 2016-01-14 MED ORDER — ALBUTEROL SULFATE (2.5 MG/3ML) 0.083% IN NEBU: 2.5000 mg | INHALATION_SOLUTION | RESPIRATORY_TRACT | Status: DC | PRN

## 2016-01-14 MED ORDER — HYDRALAZINE HCL 20 MG/ML IJ SOLN
10.0000 mg | Freq: Four times a day (QID) | INTRAMUSCULAR | Status: DC | PRN
  Administered 2016-01-14: 10 mg via INTRAVENOUS
  Filled 2016-01-14: qty 1

## 2016-01-14 MED ORDER — PANTOPRAZOLE SODIUM 40 MG PO TBEC
40.0000 mg | DELAYED_RELEASE_TABLET | Freq: Every day | ORAL | Status: DC
  Administered 2016-01-15 – 2016-01-16 (×2): 40 mg via ORAL
  Filled 2016-01-14 (×2): qty 1

## 2016-01-14 MED ORDER — SODIUM CHLORIDE 0.9% FLUSH
3.0000 mL | INTRAVENOUS | Status: DC | PRN
Start: 2016-01-14 — End: 2016-01-16

## 2016-01-14 MED ORDER — SODIUM CHLORIDE 0.9% FLUSH
3.0000 mL | Freq: Two times a day (BID) | INTRAVENOUS | Status: DC
  Administered 2016-01-15 – 2016-01-16 (×3): 3 mL via INTRAVENOUS

## 2016-01-14 MED ORDER — POLYETHYLENE GLYCOL 3350 17 G PO PACK: 17.0000 g | PACK | Freq: Every day | ORAL | Status: DC | PRN

## 2016-01-14 MED ORDER — SODIUM CHLORIDE 0.9 % IV SOLN: 250.0000 mL | INTRAVENOUS | Status: DC | PRN

## 2016-01-14 MED ORDER — BISACODYL 5 MG PO TBEC
5.0000 mg | DELAYED_RELEASE_TABLET | Freq: Every day | ORAL | Status: DC | PRN
Start: 2016-01-14 — End: 2016-01-16

## 2016-01-14 MED ORDER — SODIUM CHLORIDE 0.9% FLUSH
3.0000 mL | Freq: Two times a day (BID) | INTRAVENOUS | Status: DC
  Administered 2016-01-14: 3 mL via INTRAVENOUS

## 2016-01-14 MED ORDER — ASPIRIN EC 81 MG PO TBEC
81.0000 mg | DELAYED_RELEASE_TABLET | Freq: Every day | ORAL | Status: DC
  Administered 2016-01-14 – 2016-01-16 (×3): 81 mg via ORAL
  Filled 2016-01-14 (×2): qty 1

## 2016-01-14 MED ORDER — LISINOPRIL 10 MG PO TABS
20.0000 mg | ORAL_TABLET | Freq: Every day | ORAL | Status: DC
  Administered 2016-01-14 – 2016-01-16 (×3): 20 mg via ORAL
  Filled 2016-01-14 (×3): qty 2

## 2016-01-14 MED ORDER — ONDANSETRON HCL 4 MG/2ML IJ SOLN
4.0000 mg | Freq: Four times a day (QID) | INTRAMUSCULAR | Status: DC | PRN
  Filled 2016-01-14: qty 2

## 2016-01-14 MED ORDER — PANTOPRAZOLE SODIUM 20 MG PO TBEC: 20.0000 mg | DELAYED_RELEASE_TABLET | Freq: Every day | ORAL | Status: DC

## 2016-01-14 MED ORDER — LISINOPRIL 10 MG PO TABS
ORAL_TABLET | ORAL | Status: AC
  Filled 2016-01-14: qty 2

## 2016-01-14 MED ORDER — AMLODIPINE BESYLATE 5 MG PO TABS
ORAL_TABLET | ORAL | Status: AC
  Filled 2016-01-14: qty 2

## 2016-01-14 MED ORDER — ACETAMINOPHEN 325 MG PO TABS
650.0000 mg | ORAL_TABLET | Freq: Four times a day (QID) | ORAL | Status: DC | PRN
Start: 2016-01-14 — End: 2016-01-16

## 2016-01-14 MED ORDER — ENOXAPARIN SODIUM 40 MG/0.4ML ~~LOC~~ SOLN
40.0000 mg | SUBCUTANEOUS | Status: DC
  Administered 2016-01-14 – 2016-01-15 (×2): 40 mg via SUBCUTANEOUS
  Filled 2016-01-14 (×2): qty 0.4

## 2016-01-14 MED ORDER — ONDANSETRON HCL 4 MG PO TABS: 4.0000 mg | ORAL_TABLET | Freq: Four times a day (QID) | ORAL | Status: DC | PRN

## 2016-01-14 MED ORDER — HYDRALAZINE HCL 20 MG/ML IJ SOLN
5.0000 mg | Freq: Once | INTRAMUSCULAR | Status: AC
  Administered 2016-01-14: 5 mg via INTRAVENOUS
  Filled 2016-01-14: qty 1

## 2016-01-14 MED ORDER — AMLODIPINE BESYLATE 5 MG PO TABS
10.0000 mg | ORAL_TABLET | Freq: Every day | ORAL | Status: DC
  Administered 2016-01-14 – 2016-01-16 (×3): 10 mg via ORAL
  Filled 2016-01-14 (×2): qty 2

## 2016-01-14 NOTE — Progress Notes (Signed)
Pt is alert and oriented x 4, family at bedside, bp dropped when standing with 1 episode of emesis, patients reports that she no longer felt nauseous once lying back down. Patient is on room air, leg pain improving with prn medication.
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
## 2016-01-14 NOTE — Progress Notes (Signed)
PT Cancellation Note  Patient Details Name: Amanda SpatesDorothy L McMillian Lawrence MRN: [id number] DOB: [birth year]   Cancelled Treatment:    Reason Eval/Treat Not Completed: Medical issues which prohibited therapy. Patient noted to have elevated BP throughout the day, as ambulation and physical activity typically increase BP, PT will hold mobility consult for now until patient is more stable to participate.   Kerin RansomPatrick A Cheyan Frees, PT, DPT    01/14/2016, 5:13 PM

## 2016-01-14 NOTE — ED Notes (Signed)
Pt to CT

## 2016-01-14 NOTE — ED Notes (Signed)
Report from Jordan, RN.

## 2016-01-14 NOTE — ED Notes (Signed)
Amy, RN notified pt blood pressure 160/63 and pt will be transferred to the floor.

## 2016-01-14 NOTE — ED Notes (Signed)
Report called to Amy, RN. RN will not accept patient until blood pressure parameters are met. Pt in Ultrasound at this time. Unable to given PRN orders at this time.  

## 2016-01-14 NOTE — ED Notes (Signed)
Patient transported to Ultrasound 

## 2016-01-15 ENCOUNTER — Encounter: Payer: Self-pay | Admitting: Radiology

## 2016-01-15 ENCOUNTER — Observation Stay: Payer: BLUE CROSS/BLUE SHIELD

## 2016-01-15 ENCOUNTER — Observation Stay
Admit: 2016-01-15 | Discharge: 2016-01-15 | Disposition: A | Discharge status: Home / Self Care | Payer: BLUE CROSS/BLUE SHIELD | Attending: Internal Medicine | Admitting: Internal Medicine

## 2016-01-15 DIAGNOSIS — E876 Hypokalemia: Secondary | ICD-10-CM | POA: Diagnosis not present

## 2016-01-15 DIAGNOSIS — I472 Ventricular tachycardia: Secondary | ICD-10-CM

## 2016-01-15 DIAGNOSIS — R55 Syncope and collapse: Secondary | ICD-10-CM

## 2016-01-15 DIAGNOSIS — R001 Bradycardia, unspecified: Secondary | ICD-10-CM

## 2016-01-15 DIAGNOSIS — I10 Essential (primary) hypertension: Secondary | ICD-10-CM | POA: Diagnosis present

## 2016-01-15 LAB — ECHOCARDIOGRAM COMPLETE
AV Area VTI: 1.96 cm2
AV Peak grad: 5 mmHg
AV peak Index: 1.09
AV pk vel: 117 cm/s
Ao pk vel: 0.63 m/s
E decel time: 306 msec
E/e' ratio: 15.22
FS: 33 % (ref 28–44)
Height: 65 in
IVS/LV PW RATIO, ED: 1.22
LA ID, A-P, ES: 42 mm
LA diam end sys: 42 mm
LA diam index: 2.33 cm/m2
LA vol A4C: 61.5 ml
LA vol index: 37.6 mL/m2
LA vol: 67.6 mL
LV E/e' medial: 15.22
LV E/e'average: 15.22
LV PW d: 11.8 mm — AB (ref 0.6–1.1)
LV e' LATERAL: 6.53 cm/s
LVOT area: 3.14 cm2
LVOT diameter: 20 mm
LVOT peak vel: 73.2 cm/s
MV Dec: 306
MV Peak grad: 4 mmHg
MV pk A vel: 93.1 m/s
MV pk E vel: 99.4 m/s
TDI e' lateral: 6.53
TDI e' medial: 5.87
Weight: 2579.2 oz

## 2016-01-15 LAB — CBC
HEMATOCRIT: 35.8 % (ref 35.0–47.0)
Hemoglobin: 12 g/dL (ref 12.0–16.0)
MCH: 26.8 pg (ref 26.0–34.0)
MCHC: 33.4 g/dL (ref 32.0–36.0)
MCV: 80.1 fL (ref 80.0–100.0)
PLATELETS: 181 10*3/uL (ref 150–440)
RBC: 4.47 MIL/uL (ref 3.80–5.20)
RDW: 20.4 % — AB (ref 11.5–14.5)
WBC: 4.3 10*3/uL (ref 3.6–11.0)

## 2016-01-15 LAB — BASIC METABOLIC PANEL
Anion gap: 7 (ref 5–15)
BUN: 14 mg/dL (ref 6–20)
CO2: 27 mmol/L (ref 22–32)
Calcium: 9.4 mg/dL (ref 8.9–10.3)
Chloride: 107 mmol/L (ref 101–111)
Creatinine, Ser: 0.75 mg/dL (ref 0.44–1.00)
GFR calc Af Amer: >60 mL/min (ref >60)
GFR calc non Af Amer: >60 mL/min (ref >60)
Glucose, Bld: 90 mg/dL (ref 65–99)
Potassium: 2.8 mmol/L — ABNORMAL LOW (ref 3.5–5.1)
Sodium: 141 mmol/L (ref 135–145)

## 2016-01-15 LAB — TSH: TSH: 2.737 u[IU]/mL (ref 0.350–4.500)

## 2016-01-15 LAB — LIPID PANEL
Cholesterol: 191 mg/dL (ref 0–200)
HDL: 91 mg/dL (ref >40)
LDL CALC: 86 mg/dL (ref 0–99)
Total CHOL/HDL Ratio: 2.1 RATIO
Triglycerides: 69 mg/dL (ref <150)
VLDL: 14 mg/dL (ref 0–40)

## 2016-01-15 LAB — MAGNESIUM: Magnesium: 1.7 mg/dL (ref 1.7–2.4)

## 2016-01-15 LAB — POTASSIUM: POTASSIUM: 3.8 mmol/L (ref 3.5–5.1)

## 2016-01-15 MED ORDER — SPIRONOLACTONE 25 MG PO TABS
25.0000 mg | ORAL_TABLET | Freq: Every day | ORAL | Status: DC
  Administered 2016-01-15 – 2016-01-16 (×2): 25 mg via ORAL
  Filled 2016-01-15 (×2): qty 1

## 2016-01-15 MED ORDER — POTASSIUM CHLORIDE 10 MEQ/100ML IV SOLN
10.0000 meq | INTRAVENOUS | Status: AC
  Administered 2016-01-15 (×2): 10 meq via INTRAVENOUS
  Filled 2016-01-15 (×2): qty 100

## 2016-01-15 MED ORDER — IOPAMIDOL (ISOVUE-370) INJECTION 76%
100.0000 mL | Freq: Once | INTRAVENOUS | Status: AC | PRN
  Administered 2016-01-15: 100 mL via INTRAVENOUS

## 2016-01-15 MED ORDER — POTASSIUM CHLORIDE CRYS ER 20 MEQ PO TBCR
20.0000 meq | EXTENDED_RELEASE_TABLET | Freq: Once | ORAL | Status: AC
  Administered 2016-01-15: 20 meq via ORAL
  Filled 2016-01-15: qty 1

## 2016-01-15 MED ORDER — MAGNESIUM SULFATE 2 GM/50ML IV SOLN
2.0000 g | Freq: Once | INTRAVENOUS | Status: AC
  Administered 2016-01-15: 2 g via INTRAVENOUS
  Filled 2016-01-15: qty 50

## 2016-01-15 MED ORDER — POTASSIUM CHLORIDE CRYS ER 20 MEQ PO TBCR
40.0000 meq | EXTENDED_RELEASE_TABLET | Freq: Once | ORAL | Status: AC
  Administered 2016-01-15: 40 meq via ORAL
  Filled 2016-01-15: qty 2
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
## 2016-01-15 NOTE — Progress Notes (Signed)
Potassium 10 MeQ IVPB burns at the IV site.  Rate reduced to 50 ml/hr.  Second dose will start at 1830

## 2016-01-15 NOTE — Progress Notes (Signed)
Second potassium IVPB started.
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
## 2016-01-15 NOTE — Progress Notes (Signed)
*  PRELIMINARY RESULTS* Echocardiogram 2D Echocardiogram has been performed.  Cristela BlueHege, Sonji Starkes 01/15/2016, 8:26 AM
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
## 2016-01-15 NOTE — Progress Notes (Signed)
MEDICATION RELATED CONSULT NOTE - INITIAL   Pharmacy Consult for electrolyte replacement Indication: Hypokalemia  Allergies  Allergen Reactions  . Penicillins     Has patient had a PCN reaction causing immediate rash, facial/tongue/throat swelling, SOB or lightheadedness with hypotension: unsure Has patient had a PCN reaction causing severe rash involving mucus membranes or skin necrosis: unsure Has patient had a PCN reaction that required hospitalization: unsure Has patient had a PCN reaction occurring within the last 10 years: unsure If all of the above answers are "NO", then may proceed with Cephalosporin use.     Patient Measurements: Height: 5\' 5"  (165.1 cm) Weight: 161 lb 3.2 oz (73.12 kg) IBW/kg (Calculated) : 57   Vital Signs: Temp: 98.6 F (37 C) (06/27 1931) Temp Source: Oral (06/27 1931) BP: 160/55 mmHg (06/27 1931) Pulse Rate: 57 (06/27 1931) Intake/Output from previous day: 06/26 0701 - 06/27 0700 In: 3 [I.V.:3] Out: 400 [Urine:400] Intake/Output from this shift: Total I/O In: -  Out: 500 [Urine:500]  Labs:  Recent Labs  01/14/16 0847 01/15/16 0520  WBC 5.1 4.3  HGB 11.9* 12.0  HCT 35.2 35.8  PLT 170 181  CREATININE 0.56 0.75  MG  --  1.7   Estimated Creatinine Clearance: 65.5 mL/min (by C-G formula based on Cr of 0.75).    Assessment: 70 yo female with hypokalemia  K 2.8, Mag 1.7 - pt already received KCl 40 mEQ po x1  Goal of Therapy:  K 3.5-5.0 Mag ~2 per MD  Plan:  KCl 10 mEq IV x2 runs and KCL 20 mEq PO x1 Mag sulfate 2 g IV x1 K recheck at 2000 BMET and Mag level in AM  6/27:  K = 3.8 .  No further supplementation needed.  Will recheck K on 6/28 with AM labs.   Pharmacy will continue to follow.   Abdurahman Rugg D 01/15/2016,9:08 PM

## 2016-01-15 NOTE — Progress Notes (Signed)
MEDICATION RELATED CONSULT NOTE - INITIAL   Pharmacy Consult for electrolyte replacement Indication: Hypokalemia  Allergies  Allergen Reactions  . Penicillins     Has patient had a PCN reaction causing immediate rash, facial/tongue/throat swelling, SOB or lightheadedness with hypotension: unsure Has patient had a PCN reaction causing severe rash involving mucus membranes or skin necrosis: unsure Has patient had a PCN reaction that required hospitalization: unsure Has patient had a PCN reaction occurring within the last 10 years: unsure If all of the above answers are "NO", then may proceed with Cephalosporin use.     Patient Measurements: Height: 5\' 5"  (165.1 cm) Weight: 161 lb 3.2 oz (73.12 kg) IBW/kg (Calculated) : 57   Vital Signs: Temp: 97.7 F (36.5 C) (06/27 1235) Temp Source: Oral (06/27 1235) BP: 155/54 mmHg (06/27 1235) Pulse Rate: 57 (06/27 1235) Intake/Output from previous day: 06/26 0701 - 06/27 0700 In: 3 [I.V.:3] Out: 400 [Urine:400] Intake/Output from this shift: Total I/O In: 240 [P.O.:240] Out: 0   Labs:  Recent Labs  01/14/16 0847 01/15/16 0520  WBC 5.1 4.3  HGB 11.9* 12.0  HCT 35.2 35.8  PLT 170 181  CREATININE 0.56 0.75  MG  --  1.7   Estimated Creatinine Clearance: 65.5 mL/min (by C-G formula based on Cr of 0.75).    Assessment: 70 yo female with hypokalemia  K 2.8, Mag 1.7 - pt already received KCl 40 mEQ po x1  Goal of Therapy:  K 3.5-5.0 Mag ~2 per MD  Plan:  KCl 10 mEq IV x2 runs and KCL 20 mEq PO x1 Mag sulfate 2 g IV x1 K recheck at 2000 BMET and Mag level in AM  Pharmacy will continue to follow.   Marty HeckWang, Lion Fernandez L 01/15/2016,3:36 PM

## 2016-01-15 NOTE — Progress Notes (Signed)
Pt is alert and oriented, up to bsc with stand by assist, extensive testing performed throughout shift, neurology consulted with patient, fair appetite, report given to Jancy at 15:00, vital signs stable.
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
## 2016-01-15 NOTE — Care Management Obs Status (Signed)
MEDICARE OBSERVATION STATUS NOTIFICATION   Patient Details  Name: Amanda Lawrence MRN: [id number] Date of Birth: 1945-08-02   Medicare Observation Status Notification Given:  Yes  medicare is secondary payor    Eber HongGreene, Shaquayla Klimas R, RN 01/15/2016, 3:31 PM

## 2016-01-16 ENCOUNTER — Observation Stay (HOSPITAL_BASED_OUTPATIENT_CLINIC_OR_DEPARTMENT_OTHER): Payer: BLUE CROSS/BLUE SHIELD

## 2016-01-16 DIAGNOSIS — R55 Syncope and collapse: Secondary | ICD-10-CM | POA: Diagnosis not present

## 2016-01-16 DIAGNOSIS — I160 Hypertensive urgency: Secondary | ICD-10-CM | POA: Insufficient documentation

## 2016-01-16 DIAGNOSIS — R0602 Shortness of breath: Secondary | ICD-10-CM | POA: Diagnosis not present

## 2016-01-16 LAB — NM MYOCAR MULTI W/SPECT W/WALL MOTION / EF
CHL CUP NUCLEAR SDS: 2
CHL CUP NUCLEAR SSS: 1
CSEPHR: 63 %
LV sys vol: 43 mL
LVDIAVOL: 90 mL (ref 46–106)
Peak HR: 95 {beats}/min
Rest HR: 50 {beats}/min
SRS: 1
TID: 0.92

## 2016-01-16 LAB — BASIC METABOLIC PANEL
ANION GAP: 6 (ref 5–15)
BUN: 16 mg/dL (ref 6–20)
CALCIUM: 9.4 mg/dL (ref 8.9–10.3)
CO2: 25 mmol/L (ref 22–32)
Chloride: 108 mmol/L (ref 101–111)
Creatinine, Ser: 0.71 mg/dL (ref 0.44–1.00)
GFR calc Af Amer: >60 mL/min (ref >60)
GLUCOSE: 98 mg/dL (ref 65–99)
POTASSIUM: 4 mmol/L (ref 3.5–5.1)
SODIUM: 139 mmol/L (ref 135–145)

## 2016-01-16 LAB — MAGNESIUM: MAGNESIUM: 2.1 mg/dL (ref 1.7–2.4)

## 2016-01-16 MED ORDER — FUROSEMIDE 20 MG PO TABS: 20.0000 mg | ORAL_TABLET | Freq: Every day | ORAL | Status: DC | PRN

## 2016-01-16 MED ORDER — SPIRONOLACTONE 25 MG PO TABS: 25.0000 mg | ORAL_TABLET | Freq: Every day | ORAL | Status: DC

## 2016-01-16 MED ORDER — ASPIRIN 81 MG PO TBEC: 81.0000 mg | DELAYED_RELEASE_TABLET | Freq: Every day | ORAL | Status: AC

## 2016-01-16 MED ORDER — REGADENOSON 0.4 MG/5ML IV SOLN
0.4000 mg | Freq: Once | INTRAVENOUS | Status: AC
  Administered 2016-01-16: 0.4 mg via INTRAVENOUS

## 2016-01-16 MED ORDER — TECHNETIUM TC 99M TETROFOSMIN IV KIT
11.9000 | PACK | Freq: Once | INTRAVENOUS | Status: AC | PRN
  Administered 2016-01-16: 11.9 via INTRAVENOUS

## 2016-01-16 MED ORDER — LISINOPRIL 20 MG PO TABS: 20.0000 mg | ORAL_TABLET | Freq: Every day | ORAL | Status: DC

## 2016-01-16 MED ORDER — TECHNETIUM TC 99M TETROFOSMIN IV KIT
30.1260 | PACK | Freq: Once | INTRAVENOUS | Status: AC | PRN
  Administered 2016-01-16: 30.126 via INTRAVENOUS

## 2016-01-16 NOTE — Progress Notes (Signed)
Discharge instructions explained to pt/ verbalized an understanding/ iv and tele removed/ holter monitor placed/ work note given to pt/  will transport off unit via wheelchair.

## 2016-01-16 NOTE — Progress Notes (Signed)
[facility] [place] [facility]        Corliss MarcusDorothy McMillian Lawrence was admitted to the Hospital on 01/14/2016 and Discharged  01/16/2016 and should be excused from work  for 4 days starting 01/14/2016 , may return to work without any restrictions on 01/21/2016.  Delfino LovettSHAH, Serena Petterson M.D on 01/16/2016,at 2:02 PM  [facility] [place] [facility]    Office  [phone]

## 2016-01-16 NOTE — Progress Notes (Signed)
Holter monitor ordered for 72hrs post discharge/ only have 24-48hr Holter monitors available/ Dr. Sherryll BurgerShah made aware/ order changed to 48hr Holter monitor/ Cardiopulmonary made aware of order and will place monitor before discharge.

## 2016-01-16 NOTE — Progress Notes (Signed)
MEDICATION RELATED CONSULT NOTE - INITIAL   Pharmacy Consult for electrolyte replacement Indication: Hypokalemia  Allergies  Allergen Reactions  . Penicillins     Has patient had a PCN reaction causing immediate rash, facial/tongue/throat swelling, SOB or lightheadedness with hypotension: unsure Has patient had a PCN reaction causing severe rash involving mucus membranes or skin necrosis: unsure Has patient had a PCN reaction that required hospitalization: unsure Has patient had a PCN reaction occurring within the last 10 years: unsure If all of the above answers are "NO", then may proceed with Cephalosporin use.     Patient Measurements: Height: 5\' 5"  (165.1 cm) Weight: 163 lb 3.2 oz (74.027 kg) IBW/kg (Calculated) : 57    Labs:  Recent Labs  01/14/16 0847 01/15/16 0520 01/16/16 0528  WBC 5.1 4.3  --   HGB 11.9* 12.0  --   HCT 35.2 35.8  --   PLT 170 181  --   CREATININE 0.56 0.75 0.71  MG  --  1.7 2.1   Estimated Creatinine Clearance: 65.9 mL/min (by C-G formula based on Cr of 0.71).   BMP Latest Ref Rng 01/16/2016 01/15/2016 01/15/2016  Glucose 65 - 99 mg/dL 98 - 90  BUN 6 - 20 mg/dL 16 - 14  Creatinine 1.610.44 - 1.00 mg/dL 0.960.71 - 0.450.75  Sodium 409135 - 145 mmol/L 139 - 141  Potassium 3.5 - 5.1 mmol/L 4.0 3.8 2.8(L)  Chloride 101 - 111 mmol/L 108 - 107  CO2 22 - 32 mmol/L 25 - 27  Calcium 8.9 - 10.3 mg/dL 9.4 - 9.4     Assessment: 70 yo female with hypokalemia  Goal of Therapy:  K 3.5-5.0 Mag ~2 per MD  Plan:  Electrolytes WNL, no supplementation needed. Patient started on lisinopril 6/26, spironolactone 6/27. Will continue to monitor.  Recheck with AM labs  Thurl Boen C 01/16/2016,11:56 AM

## 2016-01-16 NOTE — Progress Notes (Signed)
   No acute overnight events. She is for [employer] My[employer] this morning given her syncopal event as well as slow polymorphic VT seen on telemetry. Await results this evening. Hypokalemia currently being repleted and up to 3.8 this morning. She was started on spironolactone and will need close outpatient follow up to check her potassium level. Renin/aldosterone level pending given her hypertension and hypokalemia. On telemetry overnight she has been in sinus bradycardia with rare PVC and a 5 beat run of atrial tachycardia. No further polymorphic VT. Would continue to avoid AV nodal blocking agents. Recommend 30-day outpatient cardiac monitoring if workup remains unremarkable.    Eula Listenyan Dunn, PA-C 01/16/2016 7:50 AM

## 2016-01-17 NOTE — Discharge Summary (Signed)
[facility] [place] at [facility]   PATIENT NAME: Amanda MarcusDorothy McMillian Pettiford    MR#:  [id number]  DATE OF BIRTH:  [birth year]  DATE OF ADMISSION:  01/14/2016 ADMITTING PHYSICIAN: Milagros LollSrikar Sudini, MD  DATE OF DISCHARGE: 01/16/2016  2:45 PM  PRIMARY CARE PHYSICIAN: Marisue IvanLINTHAVONG, KANHKA, MD    ADMISSION DIAGNOSIS:  Neck pain [M54.2] Syncope [R55] Hypertensive urgency [I16.0] Syncope, unspecified syncope type [R55]  DISCHARGE DIAGNOSIS:  Principal Problem:   Syncope Active Problems:   Essential hypertension, malignant   Hypertensive urgency  SECONDARY DIAGNOSIS:   Past Medical History  Diagnosis Date  . Hypertension   . GERD (gastroesophageal reflux disease)   . Anemia     HOSPITAL COURSE:  * Syncope exact ology is unclear. Most Neuro and Cardiac w/up neg. - overall worrisome for arrythmia - outpt holter monitor  * Hypokalemia - Repleted  * Left upper extremity weakness likely due to her neck pain or muscle strain Neg CT scan of the head. Neuro checks. PT - No need MRI brain neg for acute patho  * Accelerated hypertension - improved  * Mild chronic anemia stable  DISCHARGE CONDITIONS:   stable  CONSULTS OBTAINED:  Treatment Team:  Duke SalviaSteven C Klein, MD Thana FarrLeslie Reynolds, MD  DRUG ALLERGIES:   Allergies  Allergen Reactions  . Penicillins     Has patient had a PCN reaction causing immediate rash, facial/tongue/throat swelling, SOB or lightheadedness with hypotension: unsure Has patient had a PCN reaction causing severe rash involving mucus membranes or skin necrosis: unsure Has patient had a PCN reaction that required hospitalization: unsure Has patient had a PCN reaction occurring within the last 10 years: unsure If all of the above answers are "NO", then may proceed with Cephalosporin use.     DISCHARGE MEDICATIONS:   Discharge Medication List as of 01/16/2016  1:28 PM    START taking these medications   Details    aspirin EC 81 MG EC tablet Take 1 tablet (81 mg total) by mouth daily., Starting 01/16/2016, Until Discontinued, Normal    furosemide (LASIX) 20 MG tablet Take 1 tablet (20 mg total) by mouth daily as needed., Starting 01/16/2016, Until Discontinued, Normal    lisinopril (PRINIVIL,ZESTRIL) 20 MG tablet Take 1 tablet (20 mg total) by mouth daily., Starting 01/16/2016, Until Discontinued, Normal    spironolactone (ALDACTONE) 25 MG tablet Take 1 tablet (25 mg total) by mouth daily., Starting 01/16/2016, Until Discontinued, Normal      CONTINUE these medications which have NOT CHANGED   Details  acetaminophen (TYLENOL) 500 MG tablet Take 500 mg by mouth every 6 (six) hours as needed. , Starting 07/23/2015, Until Discontinued, Historical Med    amLODipine (NORVASC) 5 MG tablet Take 5 mg by mouth daily. , Starting 08/08/2015, Until Thu 08/07/16, Historical Med    ferrous sulfate 325 (65 FE) MG tablet Take 325 mg by mouth daily. , Until Discontinued, Historical Med    pantoprazole (PROTONIX) 20 MG tablet Take 20 mg by mouth daily. , Until Discontinued, Historical Med         DISCHARGE INSTRUCTIONS:    DIET:  Cardiac diet  DISCHARGE CONDITION:  Good  ACTIVITY:  Activity as tolerated  OXYGEN:  Home Oxygen: No.   Oxygen Delivery: room air  DISCHARGE LOCATION:  home   If you experience worsening of your admission symptoms, develop shortness of breath, life threatening emergency, suicidal or homicidal thoughts you must seek medical attention immediately by calling 911 or calling your  MD immediately  if symptoms less severe.  You Must read complete instructions/literature along with all the possible adverse reactions/side effects for all the Medicines you take and that have been prescribed to you. Take any new Medicines after you have completely understood and accpet all the possible adverse reactions/side effects.   Please note  You were cared for by a hospitalist during your hospital  stay. If you have any questions about your discharge medications or the care you received while you were in the hospital after you are discharged, you can call the unit and asked to speak with the hospitalist on call if the hospitalist that took care of you is not available. Once you are discharged, your primary care physician will handle any further medical issues. Please note that NO REFILLS for any discharge medications will be authorized once you are discharged, as it is imperative that you return to your primary care physician (or establish a relationship with a primary care physician if you do not have one) for your aftercare needs so that they can reassess your need for medications and monitor your lab values.    On the day of Discharge:  VITAL SIGNS:  Blood pressure 147/60, pulse 54, temperature 97.9 F (36.6 C), temperature source Oral, resp. rate 18, height 5\' 5"  (1.651 m), weight 74.027 kg (163 lb 3.2 oz), SpO2 100 %.  PHYSICAL EXAMINATION:  GENERAL:  70 y.o.-year-old patient lying in the bed with no acute distress.  EYES: Pupils equal, round, reactive to light and accommodation. No scleral icterus. Extraocular muscles intact.  HEENT: Head atraumatic, normocephalic. Oropharynx and nasopharynx clear.  NECK:  Supple, no jugular venous distention. No thyroid enlargement, no tenderness.  LUNGS: Normal breath sounds bilaterally, no wheezing, rales,rhonchi or crepitation. No use of accessory muscles of respiration.  CARDIOVASCULAR: S1, S2 normal. No murmurs, rubs, or gallops.  ABDOMEN: Soft, non-tender, non-distended. Bowel sounds present. No organomegaly or mass.  EXTREMITIES: No pedal edema, cyanosis, or clubbing.  NEUROLOGIC: Cranial nerves II through XII are intact. Muscle strength 5/5 in all extremities. Sensation intact. Gait not checked.  PSYCHIATRIC: The patient is alert and oriented x 3.  SKIN: No obvious rash, lesion, or ulcer.  DATA REVIEW:   CBC  Recent Labs Lab  01/15/16 0520  WBC 4.3  HGB 12.0  HCT 35.8  PLT 181    Chemistries   Recent Labs Lab 01/16/16 0528  NA 139  K 4.0  CL 108  CO2 25  GLUCOSE 98  BUN 16  CREATININE 0.71  CALCIUM 9.4  MG 2.1     RADIOLOGY:  Nm Myocar Multi W/spect W/wall Motion / Ef  01/16/2016  Pharmacological myocardial perfusion imaging study with no significant  Ischemia Mild GI uptake artifact noted Normal wall motion, EF estimated at 52% No EKG changes concerning for ischemia at peak stress or in recovery. Low risk scan Signed, Dossie Arbourim Gollan, MD, Ph.D [facility]   Follow-up Information    Follow up with Marisue IvanLINTHAVONG, KANHKA, MD. Schedule an appointment as soon as possible for a visit in 1 week.   Specialty:  Family Medicine   Why:  [facility]   Contact information:   [address] [facility] [place] [address] [phone]       Follow up with Julien Nordmannimothy Gollan, MD. Schedule an appointment as soon as possible for a visit in 2 weeks.   Specialty:  Cardiology   Why:  [facility]   Contact information:  [address] [place] [address] [phone]       Management plans discussed with the patient, family and they are in agreement.  CODE STATUS:  Code Status History    Date Active Date Inactive Code Status Order ID Comments User Context   01/14/2016  1:56 PM 01/16/2016  5:46 PM Full Code [id number]  Milagros Loll, MD ED      TOTAL TIME TAKING CARE OF THIS PATIENT: 45 minutes.    [facility], Hether Anselmo M.D on 01/17/2016 at 11:02 PM  Between 7am to 6pm - Pager - [phone]  After 6pm go to [email or web address] - password [email or web address] [facility]  [facility]  Office  [phone]  CC: Primary care physician; Marisue Ivan, MD   Note: This dictation was prepared with Dragon dictation along with smaller phrase technology. Any transcriptional errors that result from this process are unintentional.

## 2016-01-25 LAB — ALDOSTERONE + RENIN ACTIVITY W/ RATIO
ALDO / PRA Ratio: UNDETERMINED
PRA LC/MS/MS: <0.167 ng/mL/hr — ABNORMAL LOW (ref 0.167–5.380)

## 2016-03-04 DIAGNOSIS — D509 Iron deficiency anemia, unspecified: Secondary | ICD-10-CM | POA: Insufficient documentation

## 2016-03-04 NOTE — Progress Notes (Signed)
[facility]  Telephone:(336) [phone] Fax:(336) [phone]  ID: Amanda SpatesDorothy L [facility] [place] OB: 1945/09/15  MR#: [id number]  AOZ#:[id number]#:[id number]  Patient Care Team: Marisue IvanKanhka Linthavong, MD as PCP - General (Family Medicine) Wallace CullensPaul Y Oh, MD as Physician Assistant (Internal Medicine)  CHIEF COMPLAINT: Iron deficiency anemia  INTERVAL HISTORY: Patient returns to clinic today for Repeat laboratory work, further evaluation, and consideration of IV iron. She continues to feel persistently weak and fatigued.  She has not noticed any more blood in her stool.  She has no neurologic complaints. She denies any recent fevers or illnesses. She has a good appetite and denies weight loss. She denies any chest pain or shortness of breath. She denies any nausea, vomiting, constipation, or diarrhea. She has no urinary complaints. Patient offers no further specific complaints.  REVIEW OF SYSTEMS:   Review of Systems  Constitutional: Positive for malaise/fatigue. Negative for fever and weight loss.  Respiratory: Negative.  Negative for shortness of breath.   Cardiovascular: Negative.  Negative for chest pain.  Gastrointestinal: Negative.  Negative for abdominal pain, blood in stool and melena.  Musculoskeletal: Positive for joint pain.  Neurological: Positive for weakness.  Psychiatric/Behavioral: Negative.     As per HPI. Otherwise, a complete review of systems is negatve.  PAST MEDICAL HISTORY: Past Medical History:  Diagnosis Date  . Anemia   . GERD (gastroesophageal reflux disease)   . Hypertension     PAST SURGICAL HISTORY: Past Surgical History:  Procedure Laterality Date  . ABDOMINAL HYSTERECTOMY    . COLONOSCOPY WITH PROPOFOL N/A 10/08/2015   Procedure: COLONOSCOPY WITH PROPOFOL;  Surgeon: Wallace CullensPaul Y Oh, MD;  [facility];  Service: Gastroenterology;  Laterality: N/A;  . ESOPHAGOGASTRODUODENOSCOPY (EGD) WITH PROPOFOL N/A 10/08/2015   Procedure: ESOPHAGOGASTRODUODENOSCOPY  (EGD) WITH PROPOFOL;  Surgeon: Wallace CullensPaul Y Oh, MD;  [facility];  Service: Gastroenterology;  Laterality: N/A;    FAMILY HISTORY: Reviewed and unchanged. No reported history of malignancy or chronic disease.     ADVANCED DIRECTIVES:    HEALTH MAINTENANCE: Social History  Substance Use Topics  . Smoking status: Never Smoker  . Smokeless tobacco: Not on file  . Alcohol use No     Colonoscopy:  PAP:  Bone density:  Lipid panel:  Allergies  Allergen Reactions  . Penicillins Anaphylaxis    Has patient had a PCN reaction causing immediate rash, facial/tongue/throat swelling, SOB or lightheadedness with hypotension: unsure Has patient had a PCN reaction causing severe rash involving mucus membranes or skin necrosis: unsure Has patient had a PCN reaction that required hospitalization: unsure Has patient had a PCN reaction occurring within the last 10 years: unsure If all of the above answers are "NO", then may proceed with Cephalosporin use. Has patient had a PCN reaction causing immediate rash, facial/tongue/throat swelling, SOB or lightheadedness with hypotension: unsure Has patient had a PCN reaction causing severe rash involving mucus membranes or skin necrosis: unsure Has patient had a PCN reaction that required hospitalization: unsure Has patient had a PCN reaction occurring within the last 10 years: unsure If all of the above answers are "NO", then may proceed with Cephalosporin use.     Current Outpatient Prescriptions  Medication Sig Dispense Refill  . acetaminophen (TYLENOL) 500 MG tablet Take 500 mg by mouth every 6 (six) hours as needed.     Marland Kitchen. amLODipine (NORVASC) 5 MG tablet Take 5 mg by mouth daily.     Marland Kitchen. aspirin EC 81 MG EC tablet Take 1 tablet (81  mg total) by mouth daily. 30 tablet 1  . ferrous sulfate 325 (65 FE) MG tablet Take 325 mg by mouth daily.     . furosemide (LASIX) 20 MG tablet Take 1 tablet (20 mg total) by mouth daily as needed. 30 tablet 0    . lisinopril (PRINIVIL,ZESTRIL) 20 MG tablet Take 1 tablet (20 mg total) by mouth daily. 30 tablet 1  . pantoprazole (PROTONIX) 20 MG tablet Take 20 mg by mouth daily.     Marland Kitchen. spironolactone (ALDACTONE) 25 MG tablet Take 1 tablet (25 mg total) by mouth daily. 30 tablet 1  . spironolactone (ALDACTONE) 25 MG tablet Take 25 mg by mouth.     No current facility-administered medications for this visit.     OBJECTIVE: Vitals:   03/05/16 1410  BP: (!) 154/77  Pulse: (!) 44  Temp: (!) 96.9 F (36.1 C)     Body mass index is 27.83 kg/m.    ECOG FS:0 - Asymptomatic  General: Well-developed, well-nourished, no acute distress. Eyes: Pink conjunctiva, anicteric sclera. Lungs: Clear to auscultation bilaterally. Heart: Regular rate and rhythm. No rubs, murmurs, or gallops. Abdomen: Soft, nontender, nondistended. No organomegaly noted, normoactive bowel sounds. Musculoskeletal: No edema, cyanosis, or clubbing. Neuro: Alert, answering all questions appropriately. Cranial nerves grossly intact. Skin: No rashes or petechiae noted. Psych: Normal affect.  LAB RESULTS:  Lab Results  Component Value Date   NA 139 01/16/2016   K 4.0 01/16/2016   CL 108 01/16/2016   CO2 25 01/16/2016   GLUCOSE 98 01/16/2016   BUN 16 01/16/2016   CREATININE 0.71 01/16/2016   CALCIUM 9.4 01/16/2016   PROT 7.0 07/19/2015   ALBUMIN 3.2 (L) 07/19/2015   AST 22 07/19/2015   ALT 16 07/19/2015   ALKPHOS 56 07/19/2015   BILITOT 0.5 07/19/2015   GFRNONAA >60 01/16/2016   GFRAA >60 01/16/2016    Lab Results  Component Value Date   WBC 4.6 03/05/2016   NEUTROABS 2.9 03/05/2016   HGB 10.7 (L) 03/05/2016   HCT 31.5 (L) 03/05/2016   MCV 84.9 03/05/2016   PLT 208 03/05/2016   Lab Results  Component Value Date   IRON 54 03/05/2016   TIBC 276 03/05/2016   IRONPCTSAT 20 03/05/2016    Lab Results  Component Value Date   FERRITIN 678 (H) 03/05/2016     STUDIES: No results found.  ASSESSMENT: Iron  deficiency anemia.  PLAN:    1. Iron deficiency anemia: Patient's hemoglobin has improved, but is still decreased to 10.2. Her iron saturation also continues to be Mildly decreased. Proceed with 510 mg IV Feraheme today as well as B-12 injection. Return to clinic in 1 week to receive a second infusion of Feraheme. Patient will then return to clinic in 3 months with repeat laboratory work and further evaluation. The remainder of her laboratory work including hemoglobinopathy profile was either negative or within normal limits  colonoscopy and EGD on September 10, 2015 were unrevealing. Of note, patient had a normal CT scan of her abdomen and pelvis on July 19, 2015. 2. Elevated ferritin: Likely previously elevated as an acute phase reactant.  3. Hypertension: Blood pressure continues to be elevated today. Continue current medications as prescribed.  Patient expressed understanding and was in agreement with this plan. She also understands that She can call clinic at any time with any questions, concerns, or complaints.    Jeralyn Ruthsimothy J Denise Washburn, MD   03/09/2016 8:59 AM

## 2016-03-05 ENCOUNTER — Inpatient Hospital Stay: Payer: BLUE CROSS/BLUE SHIELD

## 2016-03-05 ENCOUNTER — Inpatient Hospital Stay: Payer: BLUE CROSS/BLUE SHIELD | Attending: Oncology | Admitting: Oncology

## 2016-03-05 VITALS — BP 148/79 | HR 45

## 2016-03-05 DIAGNOSIS — R5383 Other fatigue: Secondary | ICD-10-CM | POA: Insufficient documentation

## 2016-03-05 DIAGNOSIS — M2550 Pain in unspecified joint: Secondary | ICD-10-CM | POA: Insufficient documentation

## 2016-03-05 DIAGNOSIS — Z79899 Other long term (current) drug therapy: Secondary | ICD-10-CM | POA: Insufficient documentation

## 2016-03-05 DIAGNOSIS — D509 Iron deficiency anemia, unspecified: Secondary | ICD-10-CM

## 2016-03-05 DIAGNOSIS — I10 Essential (primary) hypertension: Secondary | ICD-10-CM

## 2016-03-05 DIAGNOSIS — R7989 Other specified abnormal findings of blood chemistry: Secondary | ICD-10-CM | POA: Diagnosis not present

## 2016-03-05 DIAGNOSIS — D539 Nutritional anemia, unspecified: Secondary | ICD-10-CM

## 2016-03-05 DIAGNOSIS — R531 Weakness: Secondary | ICD-10-CM | POA: Diagnosis not present

## 2016-03-05 DIAGNOSIS — D649 Anemia, unspecified: Secondary | ICD-10-CM | POA: Insufficient documentation

## 2016-03-05 LAB — CBC WITH DIFFERENTIAL/PLATELET
BASOS ABS: 0 10*3/uL (ref 0–0.1)
Basophils Relative: 0 %
EOS ABS: 0.1 10*3/uL (ref 0–0.7)
EOS PCT: 2 %
HCT: 31.5 % — ABNORMAL LOW (ref 35.0–47.0)
Hemoglobin: 10.7 g/dL — ABNORMAL LOW (ref 12.0–16.0)
Lymphocytes Relative: 27 %
Lymphs Abs: 1.2 10*3/uL (ref 1.0–3.6)
MCH: 28.9 pg (ref 26.0–34.0)
MCHC: 34 g/dL (ref 32.0–36.0)
MCV: 84.9 fL (ref 80.0–100.0)
MONO ABS: 0.4 10*3/uL (ref 0.2–0.9)
Monocytes Relative: 8 %
Neutro Abs: 2.9 10*3/uL (ref 1.4–6.5)
Neutrophils Relative %: 63 %
Platelets: 208 10*3/uL (ref 150–440)
RBC: 3.71 MIL/uL — AB (ref 3.80–5.20)
RDW: 15.7 % — AB (ref 11.5–14.5)
WBC: 4.6 10*3/uL (ref 3.6–11.0)

## 2016-03-05 LAB — IRON AND TIBC
IRON: 54 ug/dL (ref 28–170)
Saturation Ratios: 20 % (ref 10.4–31.8)
TIBC: 276 ug/dL (ref 250–450)
UIBC: 222 ug/dL

## 2016-03-05 LAB — VITAMIN B12: Vitamin B-12: 195 pg/mL (ref 180–914)

## 2016-03-05 LAB — FERRITIN: Ferritin: 678 ng/mL — ABNORMAL HIGH (ref 11–307)

## 2016-03-05 MED ORDER — FERUMOXYTOL INJECTION 510 MG/17 ML
510.0000 mg | Freq: Once | INTRAVENOUS | Status: AC
  Administered 2016-03-05: 510 mg via INTRAVENOUS
  Filled 2016-03-05: qty 17

## 2016-03-05 MED ORDER — CYANOCOBALAMIN 1000 MCG/ML IJ SOLN
1000.0000 ug | Freq: Once | INTRAMUSCULAR | Status: AC
  Administered 2016-03-05: 1000 ug via INTRAMUSCULAR
  Filled 2016-03-05: qty 1

## 2016-03-05 MED ORDER — SODIUM CHLORIDE 0.9 % IV SOLN
Freq: Once | INTRAVENOUS | Status: AC
  Administered 2016-03-05: 16:00:00 via INTRAVENOUS
  Filled 2016-03-05: qty 1000

## 2016-03-05 NOTE — Progress Notes (Signed)
Patient ambulates without assistance, bought to exam room 9.  Patient denies pain or discomfort at this time.  BP 154/77 HR 44.  Medication record updated, information provided by patient.

## 2016-03-06 ENCOUNTER — Ambulatory Visit: Payer: Medicare Other | Admitting: Oncology

## 2016-03-06 ENCOUNTER — Other Ambulatory Visit: Payer: Medicare Other

## 2016-03-06 ENCOUNTER — Ambulatory Visit: Payer: Medicare Other

## 2016-03-12 ENCOUNTER — Inpatient Hospital Stay: Payer: BLUE CROSS/BLUE SHIELD

## 2016-03-12 VITALS — BP 130/76 | HR 50 | Temp 96.6°F | Resp 16

## 2016-03-12 DIAGNOSIS — D509 Iron deficiency anemia, unspecified: Secondary | ICD-10-CM

## 2016-03-12 MED ORDER — SODIUM CHLORIDE 0.9 % IV SOLN
Freq: Once | INTRAVENOUS | Status: AC
  Administered 2016-03-12: 15:00:00 via INTRAVENOUS
  Filled 2016-03-12: qty 1000

## 2016-03-12 MED ORDER — FERUMOXYTOL INJECTION 510 MG/17 ML
510.0000 mg | Freq: Once | INTRAVENOUS | Status: AC
  Administered 2016-03-12: 510 mg via INTRAVENOUS
  Filled 2016-03-12: qty 17

## 2016-06-11 ENCOUNTER — Ambulatory Visit: Payer: Medicare Other | Admitting: Oncology

## 2016-06-11 ENCOUNTER — Ambulatory Visit: Payer: Medicare Other

## 2016-06-11 ENCOUNTER — Other Ambulatory Visit: Payer: Medicare Other

## 2016-06-17 NOTE — Progress Notes (Signed)
[facility]  Telephone:(336) [phone] Fax:(336) [phone]  ID: Amanda Lawrence OB: Dec 14, 1945  MR#: [id number]  AOZ#:[id number]#:[id number]  Patient Care Team: Marisue IvanKanhka Linthavong, MD as PCP - General (Family Medicine) Wallace CullensPaul Y Oh, MD as Physician Assistant (Internal Medicine)  CHIEF COMPLAINT: Iron deficiency anemia  INTERVAL HISTORY: Patient returns to clinic today for repeat laboratory work, further evaluation, and consideration of IV iron. She currently feels well and is asymptomatic. She has no neurologic complaints. She denies any recent fevers or illnesses. She has a good appetite and denies weight loss. She denies any chest pain or shortness of breath. She denies any nausea, vomiting, constipation, or diarrhea. She has no urinary complaints. Patient offers no specific complaints today.  REVIEW OF SYSTEMS:   Review of Systems  Constitutional: Positive for malaise/fatigue. Negative for fever and weight loss.  Respiratory: Negative.  Negative for shortness of breath.   Cardiovascular: Negative.  Negative for chest pain.  Gastrointestinal: Negative.  Negative for abdominal pain, blood in stool and melena.  Musculoskeletal: Positive for joint pain.  Neurological: Positive for weakness.  Psychiatric/Behavioral: Negative.     As per HPI. Otherwise, a complete review of systems is negative.  PAST MEDICAL HISTORY: Past Medical History:  Diagnosis Date  . Anemia   . GERD (gastroesophageal reflux disease)   . Hypertension     PAST SURGICAL HISTORY: Past Surgical History:  Procedure Laterality Date  . ABDOMINAL HYSTERECTOMY    . COLONOSCOPY WITH PROPOFOL N/A 10/08/2015   Procedure: COLONOSCOPY WITH PROPOFOL;  Surgeon: Wallace CullensPaul Y Oh, MD;  [facility];  Service: Gastroenterology;  Laterality: N/A;  . ESOPHAGOGASTRODUODENOSCOPY (EGD) WITH PROPOFOL N/A 10/08/2015   Procedure: ESOPHAGOGASTRODUODENOSCOPY (EGD) WITH PROPOFOL;  Surgeon: Wallace CullensPaul Y Oh, MD;  [facility];  Service: Gastroenterology;  Laterality: N/A;    FAMILY HISTORY: Reviewed and unchanged. No reported history of malignancy or chronic disease.     ADVANCED DIRECTIVES:    HEALTH MAINTENANCE: Social History  Substance Use Topics  . Smoking status: Never Smoker  . Smokeless tobacco: Not on file  . Alcohol use No     Colonoscopy:  PAP:  Bone density:  Lipid panel:  Allergies  Allergen Reactions  . Penicillins Anaphylaxis    Has patient had a PCN reaction causing immediate rash, facial/tongue/throat swelling, SOB or lightheadedness with hypotension: unsure Has patient had a PCN reaction causing severe rash involving mucus membranes or skin necrosis: unsure Has patient had a PCN reaction that required hospitalization: unsure Has patient had a PCN reaction occurring within the last 10 years: unsure If all of the above answers are "NO", then may proceed with Cephalosporin use. Has patient had a PCN reaction causing immediate rash, facial/tongue/throat swelling, SOB or lightheadedness with hypotension: unsure Has patient had a PCN reaction causing severe rash involving mucus membranes or skin necrosis: unsure Has patient had a PCN reaction that required hospitalization: unsure Has patient had a PCN reaction occurring within the last 10 years: unsure If all of the above answers are "NO", then may proceed with Cephalosporin use.     Current Outpatient Prescriptions  Medication Sig Dispense Refill  . aspirin EC 81 MG EC tablet Take 1 tablet (81 mg total) by mouth daily. 30 tablet 1  . lisinopril (PRINIVIL,ZESTRIL) 20 MG tablet Take 1 tablet (20 mg total) by mouth daily. 30 tablet 1  . pantoprazole (PROTONIX) 20 MG tablet Take 20 mg by mouth daily.      No current facility-administered medications for  this visit.     OBJECTIVE: Vitals:   06/18/16 1444  BP: (!) 179/99  Pulse: (!) 49  Resp: 18  Temp: 97 F (36.1 C)     Body mass index is 29.7 kg/m.    ECOG  FS:0 - Asymptomatic  General: Well-developed, well-nourished, no acute distress. Eyes: Pink conjunctiva, anicteric sclera. Lungs: Clear to auscultation bilaterally. Heart: Regular rate and rhythm. No rubs, murmurs, or gallops. Abdomen: Soft, nontender, nondistended. No organomegaly noted, normoactive bowel sounds. Musculoskeletal: No edema, cyanosis, or clubbing. Neuro: Alert, answering all questions appropriately. Cranial nerves grossly intact. Skin: No rashes or petechiae noted. Psych: Normal affect.  LAB RESULTS:  Lab Results  Component Value Date   NA 139 01/16/2016   K 4.0 01/16/2016   CL 108 01/16/2016   CO2 25 01/16/2016   GLUCOSE 98 01/16/2016   BUN 16 01/16/2016   CREATININE 0.71 01/16/2016   CALCIUM 9.4 01/16/2016   PROT 7.0 07/19/2015   ALBUMIN 3.2 (L) 07/19/2015   AST 22 07/19/2015   ALT 16 07/19/2015   ALKPHOS 56 07/19/2015   BILITOT 0.5 07/19/2015   GFRNONAA >60 01/16/2016   GFRAA >60 01/16/2016    Lab Results  Component Value Date   WBC 4.6 06/18/2016   NEUTROABS 2.7 06/18/2016   HGB 11.2 (L) 06/18/2016   HCT 33.2 (L) 06/18/2016   MCV 88.7 06/18/2016   PLT 178 06/18/2016   Lab Results  Component Value Date   IRON 55 06/18/2016   TIBC 277 06/18/2016   IRONPCTSAT 20 06/18/2016    Lab Results  Component Value Date   FERRITIN 1,038 (H) 06/18/2016     STUDIES: No results found.  ASSESSMENT: Iron deficiency anemia.  PLAN:    1. Iron deficiency anemia: Patient's hemoglobin Continues to improve, but is only mildly decreased. Her iron stores are within normal limits. No intervention is needed at this time. Patient last received 510 mg of IV Feraheme in August 2017. She also received B-12 injection at that time. The remainder of her laboratory work including hemoglobinopathy profile was either negative or within normal limits. Colonoscopy and EGD on September 10, 2015 were unrevealing. Of note, patient had a normal CT scan of her abdomen and pelvis  on July 19, 2015. Return to clinic in 3 months with repeat laboratory work and further evaluation. 2. Elevated ferritin: Likely elevated as an acute phase reactant.  3. Hypertension: Blood pressure continues to be elevated today. Continue current medications as prescribed.  Patient expressed understanding and was in agreement with this plan. She also understands that She can call clinic at any time with any questions, concerns, or complaints.    Jeralyn Ruthsimothy J Kenneth Cuaresma, MD   06/22/2016 9:05 PM

## 2016-06-18 ENCOUNTER — Inpatient Hospital Stay (HOSPITAL_BASED_OUTPATIENT_CLINIC_OR_DEPARTMENT_OTHER): Payer: BLUE CROSS/BLUE SHIELD | Admitting: Oncology

## 2016-06-18 ENCOUNTER — Inpatient Hospital Stay: Payer: BLUE CROSS/BLUE SHIELD | Attending: Oncology

## 2016-06-18 ENCOUNTER — Inpatient Hospital Stay: Payer: BLUE CROSS/BLUE SHIELD

## 2016-06-18 VITALS — BP 179/99 | HR 49 | Temp 97.0°F | Resp 18 | Wt 178.5 lb

## 2016-06-18 DIAGNOSIS — D509 Iron deficiency anemia, unspecified: Secondary | ICD-10-CM | POA: Diagnosis not present

## 2016-06-18 DIAGNOSIS — I10 Essential (primary) hypertension: Secondary | ICD-10-CM

## 2016-06-18 DIAGNOSIS — R5383 Other fatigue: Secondary | ICD-10-CM | POA: Diagnosis not present

## 2016-06-18 DIAGNOSIS — M2550 Pain in unspecified joint: Secondary | ICD-10-CM

## 2016-06-18 DIAGNOSIS — K219 Gastro-esophageal reflux disease without esophagitis: Secondary | ICD-10-CM | POA: Insufficient documentation

## 2016-06-18 DIAGNOSIS — Z7982 Long term (current) use of aspirin: Secondary | ICD-10-CM | POA: Diagnosis not present

## 2016-06-18 DIAGNOSIS — R531 Weakness: Secondary | ICD-10-CM | POA: Insufficient documentation

## 2016-06-18 DIAGNOSIS — Z79899 Other long term (current) drug therapy: Secondary | ICD-10-CM

## 2016-06-18 DIAGNOSIS — D500 Iron deficiency anemia secondary to blood loss (chronic): Secondary | ICD-10-CM

## 2016-06-18 LAB — CBC WITH DIFFERENTIAL/PLATELET
Basophils Absolute: 0 10*3/uL (ref 0–0.1)
Basophils Relative: 1 %
EOS ABS: 0.2 10*3/uL (ref 0–0.7)
Eosinophils Relative: 4 %
HEMATOCRIT: 33.2 % — AB (ref 35.0–47.0)
HEMOGLOBIN: 11.2 g/dL — AB (ref 12.0–16.0)
LYMPHS ABS: 1.3 10*3/uL (ref 1.0–3.6)
Lymphocytes Relative: 29 %
MCH: 29.8 pg (ref 26.0–34.0)
MCHC: 33.6 g/dL (ref 32.0–36.0)
MCV: 88.7 fL (ref 80.0–100.0)
MONO ABS: 0.3 10*3/uL (ref 0.2–0.9)
MONOS PCT: 7 %
NEUTROS ABS: 2.7 10*3/uL (ref 1.4–6.5)
NEUTROS PCT: 59 %
Platelets: 178 10*3/uL (ref 150–440)
RBC: 3.75 MIL/uL — ABNORMAL LOW (ref 3.80–5.20)
RDW: 13.4 % (ref 11.5–14.5)
WBC: 4.6 10*3/uL (ref 3.6–11.0)

## 2016-06-18 LAB — IRON AND TIBC
Iron: 55 ug/dL (ref 28–170)
Saturation Ratios: 20 % (ref 10.4–31.8)
TIBC: 277 ug/dL (ref 250–450)
UIBC: 222 ug/dL

## 2016-06-18 LAB — FERRITIN: FERRITIN: 1038 ng/mL — AB (ref 11–307)

## 2016-06-18 NOTE — Progress Notes (Signed)
Offers no complaints  

## 2016-09-19 ENCOUNTER — Other Ambulatory Visit: Payer: Self-pay | Admitting: Oncology

## 2016-09-21 NOTE — Progress Notes (Deleted)
[facility]  Telephone:(336) [phone] Fax:(336) [phone]  ID: Amanda Lawrence OB: March 22, 1946  MR#: [id number]  KGM#:[id number]  [name], MD as PCP - General (Family Medicine) [name], MD (Inactive) as Physician Assistant (Internal Medicine)  CHIEF COMPLAINT: Iron deficiency anemia  INTERVAL HISTORY: Patient returns to clinic today for repeat laboratory work, further evaluation, and consideration of IV iron. She currently feels well and is asymptomatic. She has no neurologic complaints. She denies any recent fevers or illnesses. She has a good appetite and denies weight loss. She denies any chest pain or shortness of breath. She denies any nausea, vomiting, constipation, or diarrhea. She has no urinary complaints. Patient offers no specific complaints today.  REVIEW OF SYSTEMS:   Review of Systems  Constitutional: Positive for malaise/fatigue. Negative for fever and weight loss.  Respiratory: Negative.  Negative for shortness of breath.   Cardiovascular: Negative.  Negative for chest pain.  Gastrointestinal: Negative.  Negative for abdominal pain, blood in stool and melena.  Musculoskeletal: Positive for joint pain.  Neurological: Positive for weakness.  Psychiatric/Behavioral: Negative.     As per HPI. Otherwise, a complete review of systems is negative.  PAST MEDICAL HISTORY: Past Medical History:  Diagnosis Date  . Anemia   . GERD (gastroesophageal reflux disease)   . Hypertension     PAST SURGICAL HISTORY: Past Surgical History:  Procedure Laterality Date  . ABDOMINAL HYSTERECTOMY    . COLONOSCOPY WITH PROPOFOL N/A 10/08/2015   Procedure: COLONOSCOPY WITH PROPOFOL;  Surgeon: [name], MD;  Location: [facility] ENDOSCOPY;  Service: Gastroenterology;  Laterality: N/A;  . ESOPHAGOGASTRODUODENOSCOPY (EGD) WITH PROPOFOL N/A 10/08/2015   Procedure: ESOPHAGOGASTRODUODENOSCOPY (EGD) WITH PROPOFOL;  Surgeon: [name], MD;   Location: [facility] ENDOSCOPY;  Service: Gastroenterology;  Laterality: N/A;    FAMILY HISTORY: Reviewed and unchanged. No reported history of malignancy or chronic disease.     ADVANCED DIRECTIVES:    HEALTH MAINTENANCE: Social History  Substance Use Topics  . Smoking status: Never Smoker  . Smokeless tobacco: Not on file  . Alcohol use No     Colonoscopy:  PAP:  Bone density:  Lipid panel:  Allergies  Allergen Reactions  . Penicillins Anaphylaxis    Has patient had a PCN reaction causing immediate rash, facial/tongue/throat swelling, SOB or lightheadedness with hypotension: unsure Has patient had a PCN reaction causing severe rash involving mucus membranes or skin necrosis: unsure Has patient had a PCN reaction that required hospitalization: unsure Has patient had a PCN reaction occurring within the last 10 years: unsure If all of the above answers are "NO", then may proceed with Cephalosporin use. Has patient had a PCN reaction causing immediate rash, facial/tongue/throat swelling, SOB or lightheadedness with hypotension: unsure Has patient had a PCN reaction causing severe rash involving mucus membranes or skin necrosis: unsure Has patient had a PCN reaction that required hospitalization: unsure Has patient had a PCN reaction occurring within the last 10 years: unsure If all of the above answers are "NO", then may proceed with Cephalosporin use.     Current Outpatient Prescriptions  Medication Sig Dispense Refill  . aspirin EC 81 MG EC tablet Take 1 tablet (81 mg total) by mouth daily. 30 tablet 1  . lisinopril (PRINIVIL,ZESTRIL) 20 MG tablet Take 1 tablet (20 mg total) by mouth daily. 30 tablet 1  . pantoprazole (PROTONIX) 20 MG tablet Take 20 mg by mouth daily.      No current facility-administered medications  for this visit.     OBJECTIVE: There were no vitals filed for this visit.   There is no height or weight on file to calculate BMI.    ECOG FS:0 -  Asymptomatic  General: Well-developed, well-nourished, no acute distress. Eyes: Pink conjunctiva, anicteric sclera. Lungs: Clear to auscultation bilaterally. Heart: Regular rate and rhythm. No rubs, murmurs, or gallops. Abdomen: Soft, nontender, nondistended. No organomegaly noted, normoactive bowel sounds. Musculoskeletal: No edema, cyanosis, or clubbing. Neuro: Alert, answering all questions appropriately. Cranial nerves grossly intact. Skin: No rashes or petechiae noted. Psych: Normal affect.  LAB RESULTS:  Lab Results  Component Value Date   NA 139 01/16/2016   K 4.0 01/16/2016   CL 108 01/16/2016   CO2 25 01/16/2016   GLUCOSE 98 01/16/2016   BUN 16 01/16/2016   CREATININE 0.71 01/16/2016   CALCIUM 9.4 01/16/2016   PROT 7.0 07/19/2015   ALBUMIN 3.2 (L) 07/19/2015   AST 22 07/19/2015   ALT 16 07/19/2015   ALKPHOS 56 07/19/2015   BILITOT 0.5 07/19/2015   GFRNONAA >60 01/16/2016   GFRAA >60 01/16/2016    Lab Results  Component Value Date   WBC 4.6 06/18/2016   NEUTROABS 2.7 06/18/2016   HGB 11.2 (L) 06/18/2016   HCT 33.2 (L) 06/18/2016   MCV 88.7 06/18/2016   PLT 178 06/18/2016   Lab Results  Component Value Date   IRON 55 06/18/2016   TIBC 277 06/18/2016   IRONPCTSAT 20 06/18/2016    Lab Results  Component Value Date   FERRITIN 1,038 (H) 06/18/2016     STUDIES: No results found.  ASSESSMENT: Iron deficiency anemia.  PLAN:    1. Iron deficiency anemia: Patient's hemoglobin Continues to improve, but is only mildly decreased. Her iron stores are within normal limits. No intervention is needed at this time. Patient last received 510 mg of IV Feraheme in August 2017. She also received B-12 injection at that time. The remainder of her laboratory work including hemoglobinopathy profile was either negative or within normal limits. Colonoscopy and EGD on September 10, 2015 were unrevealing. Of note, patient had a normal CT scan of her abdomen and pelvis on  July 19, 2015. Return to clinic in 3 months with repeat laboratory work and further evaluation. 2. Elevated ferritin: Likely elevated as an acute phase reactant.  3. Hypertension: Blood pressure continues to be elevated today. Continue current medications as prescribed.  Patient expressed understanding and was in agreement with this plan. She also understands that She can call clinic at any time with any questions, concerns, or complaints.    [name], MD   09/21/2016 10:11 PM

## 2016-09-22 ENCOUNTER — Inpatient Hospital Stay: Payer: BLUE CROSS/BLUE SHIELD | Admitting: Oncology

## 2016-09-22 ENCOUNTER — Inpatient Hospital Stay: Payer: BLUE CROSS/BLUE SHIELD

## 2016-11-05 NOTE — Progress Notes (Signed)
[facility]  Telephone:(336) [phone] Fax:(336) [phone]  ID: Amanda Lawrence OB: [birth year]  MR#: [id number]  AOZ#:[id number]  [name], MD as PCP - General (Family Medicine) [name], MD (Inactive) as Physician Assistant (Internal Medicine)  CHIEF COMPLAINT: Iron deficiency anemia  INTERVAL HISTORY: Patient returns to clinic today for repeat laboratory work, further evaluation, and consideration of IV iron. She currently feels well and is asymptomatic. She has no neurologic complaints. She denies any recent fevers or illnesses. She has a good appetite and denies weight loss. She denies any chest pain or shortness of breath. She denies any nausea, vomiting, constipation, or diarrhea. She has no urinary complaints. Patient offers no specific complaints today.  REVIEW OF SYSTEMS:   Review of Systems  Constitutional: Negative for fever, malaise/fatigue and weight loss.  Respiratory: Negative.  Negative for shortness of breath.   Cardiovascular: Negative.  Negative for chest pain and leg swelling.  Gastrointestinal: Negative.  Negative for abdominal pain, blood in stool and melena.  Musculoskeletal: Positive for joint pain.  Neurological: Negative.  Negative for weakness.  Psychiatric/Behavioral: Negative.     As per HPI. Otherwise, a complete review of systems is negative.  PAST MEDICAL HISTORY: Past Medical History:  Diagnosis Date  . Anemia   . GERD (gastroesophageal reflux disease)   . Hypertension     PAST SURGICAL HISTORY: Past Surgical History:  Procedure Laterality Date  . ABDOMINAL HYSTERECTOMY    . COLONOSCOPY WITH PROPOFOL N/A 10/08/2015   Procedure: COLONOSCOPY WITH PROPOFOL;  Surgeon: [name], MD;  Location: [facility] ENDOSCOPY;  Service: Gastroenterology;  Laterality: N/A;  . ESOPHAGOGASTRODUODENOSCOPY (EGD) WITH PROPOFOL N/A 10/08/2015   Procedure: ESOPHAGOGASTRODUODENOSCOPY (EGD) WITH PROPOFOL;  Surgeon:  [name], MD;  Location: [facility] ENDOSCOPY;  Service: Gastroenterology;  Laterality: N/A;    FAMILY HISTORY: Reviewed and unchanged. No reported history of malignancy or chronic disease.     ADVANCED DIRECTIVES:    HEALTH MAINTENANCE: Social History  Substance Use Topics  . Smoking status: Never Smoker  . Smokeless tobacco: Not on file  . Alcohol use No     Colonoscopy:  PAP:  Bone density:  Lipid panel:  Allergies  Allergen Reactions  . Penicillins Anaphylaxis    Has patient had a PCN reaction causing immediate rash, facial/tongue/throat swelling, SOB or lightheadedness with hypotension: unsure Has patient had a PCN reaction causing severe rash involving mucus membranes or skin necrosis: unsure Has patient had a PCN reaction that required hospitalization: unsure Has patient had a PCN reaction occurring within the last 10 years: unsure If all of the above answers are "NO", then may proceed with Cephalosporin use. Has patient had a PCN reaction causing immediate rash, facial/tongue/throat swelling, SOB or lightheadedness with hypotension: unsure Has patient had a PCN reaction causing severe rash involving mucus membranes or skin necrosis: unsure Has patient had a PCN reaction that required hospitalization: unsure Has patient had a PCN reaction occurring within the last 10 years: unsure If all of the above answers are "NO", then may proceed with Cephalosporin use.     Current Outpatient Prescriptions  Medication Sig Dispense Refill  . aspirin EC 81 MG EC tablet Take 1 tablet (81 mg total) by mouth daily. 30 tablet 1  . lisinopril (PRINIVIL,ZESTRIL) 20 MG tablet Take 1 tablet (20 mg total) by mouth daily. 30 tablet 1  . pantoprazole (PROTONIX) 20 MG tablet Take 20 mg by mouth daily.      No  current facility-administered medications for this visit.     OBJECTIVE: Vitals:   11/06/16 1414  BP: (!) 192/73  Pulse: (!) 48  Resp: 18  Temp: (!) 96.7 F (35.9 C)     Body mass  index is 30.09 kg/m.    ECOG FS:0 - Asymptomatic  General: Well-developed, well-nourished, no acute distress. Eyes: Pink conjunctiva, anicteric sclera. Lungs: Clear to auscultation bilaterally. Heart: Regular rate and rhythm. No rubs, murmurs, or gallops. Abdomen: Soft, nontender, nondistended. No organomegaly noted, normoactive bowel sounds. Musculoskeletal: No edema, cyanosis, or clubbing. Neuro: Alert, answering all questions appropriately. Cranial nerves grossly intact. Skin: No rashes or petechiae noted. Psych: Normal affect.  LAB RESULTS:  Lab Results  Component Value Date   NA 139 01/16/2016   K 4.0 01/16/2016   CL 108 01/16/2016   CO2 25 01/16/2016   GLUCOSE 98 01/16/2016   BUN 16 01/16/2016   CREATININE 0.71 01/16/2016   CALCIUM 9.4 01/16/2016   PROT 7.0 07/19/2015   ALBUMIN 3.2 (L) 07/19/2015   AST 22 07/19/2015   ALT 16 07/19/2015   ALKPHOS 56 07/19/2015   BILITOT 0.5 07/19/2015   GFRNONAA >60 01/16/2016   GFRAA >60 01/16/2016    Lab Results  Component Value Date   WBC 4.6 11/06/2016   NEUTROABS 3.0 11/06/2016   HGB 11.0 (L) 11/06/2016   HCT 32.6 (L) 11/06/2016   MCV 87.7 11/06/2016   PLT 185 11/06/2016   Lab Results  Component Value Date   IRON 63 11/06/2016   TIBC 270 11/06/2016   IRONPCTSAT 23 11/06/2016    Lab Results  Component Value Date   FERRITIN 1,050 (H) 11/06/2016     STUDIES: No results found.  ASSESSMENT: Iron deficiency anemia.  PLAN:    1. Iron deficiency anemia: Patient's hemoglobin continues to be decreased, but is stable. Her iron stores are within normal limits. No intervention is needed at this time. Patient last received 510 mg of IV Feraheme in August 2017. She also received B-12 injection at that time. The remainder of her laboratory work including hemoglobinopathy profile was either negative or within normal limits. Colonoscopy and EGD on September 10, 2015 were unrevealing. Of note, patient had a normal CT scan of her  abdomen and pelvis on July 19, 2015. Patient is retiring in July and then going to [place] for approximately 4 months, therefore she will return to clinic first week of July for repeat laboratory work and further evaluation. If everything continues to remain stable, she likely can be discharged from clinic.  2. Elevated ferritin: Likely elevated as an acute phase reactant. Monitor. 3. Hypertension: Blood pressure continues to be significantly elevated today. Continue current medications as prescribed.  Patient expressed understanding and was in agreement with this plan. She also understands that She can call clinic at any time with any questions, concerns, or complaints.    Jeralyn Ruths, MD   11/08/2016 6:57 AM

## 2016-11-06 ENCOUNTER — Inpatient Hospital Stay (HOSPITAL_BASED_OUTPATIENT_CLINIC_OR_DEPARTMENT_OTHER): Payer: BLUE CROSS/BLUE SHIELD | Admitting: Oncology

## 2016-11-06 ENCOUNTER — Inpatient Hospital Stay: Payer: BLUE CROSS/BLUE SHIELD | Attending: Oncology

## 2016-11-06 ENCOUNTER — Inpatient Hospital Stay: Payer: BLUE CROSS/BLUE SHIELD

## 2016-11-06 VITALS — BP 192/73 | HR 48 | Temp 96.7°F | Resp 18 | Wt 180.8 lb

## 2016-11-06 DIAGNOSIS — K219 Gastro-esophageal reflux disease without esophagitis: Secondary | ICD-10-CM | POA: Insufficient documentation

## 2016-11-06 DIAGNOSIS — Z7982 Long term (current) use of aspirin: Secondary | ICD-10-CM | POA: Diagnosis not present

## 2016-11-06 DIAGNOSIS — Z79899 Other long term (current) drug therapy: Secondary | ICD-10-CM | POA: Insufficient documentation

## 2016-11-06 DIAGNOSIS — R7989 Other specified abnormal findings of blood chemistry: Secondary | ICD-10-CM | POA: Insufficient documentation

## 2016-11-06 DIAGNOSIS — D509 Iron deficiency anemia, unspecified: Secondary | ICD-10-CM | POA: Diagnosis not present

## 2016-11-06 DIAGNOSIS — I10 Essential (primary) hypertension: Secondary | ICD-10-CM | POA: Insufficient documentation

## 2016-11-06 DIAGNOSIS — Z880 Allergy status to penicillin: Secondary | ICD-10-CM

## 2016-11-06 DIAGNOSIS — D500 Iron deficiency anemia secondary to blood loss (chronic): Secondary | ICD-10-CM

## 2016-11-06 LAB — CBC WITH DIFFERENTIAL/PLATELET
BASOS ABS: 0 10*3/uL (ref 0–0.1)
Basophils Relative: 0 %
EOS PCT: 1 %
Eosinophils Absolute: 0.1 10*3/uL (ref 0–0.7)
HCT: 32.6 % — ABNORMAL LOW (ref 35.0–47.0)
HEMOGLOBIN: 11 g/dL — AB (ref 12.0–16.0)
LYMPHS ABS: 1.2 10*3/uL (ref 1.0–3.6)
LYMPHS PCT: 26 %
MCH: 29.7 pg (ref 26.0–34.0)
MCHC: 33.9 g/dL (ref 32.0–36.0)
MCV: 87.7 fL (ref 80.0–100.0)
Monocytes Absolute: 0.3 10*3/uL (ref 0.2–0.9)
Monocytes Relative: 7 %
NEUTROS ABS: 3 10*3/uL (ref 1.4–6.5)
NEUTROS PCT: 66 %
PLATELETS: 185 10*3/uL (ref 150–440)
RBC: 3.71 MIL/uL — AB (ref 3.80–5.20)
RDW: 13.4 % (ref 11.5–14.5)
WBC: 4.6 10*3/uL (ref 3.6–11.0)

## 2016-11-06 LAB — IRON AND TIBC
IRON: 63 ug/dL (ref 28–170)
Saturation Ratios: 23 % (ref 10.4–31.8)
TIBC: 270 ug/dL (ref 250–450)
UIBC: 208 ug/dL

## 2016-11-06 LAB — FERRITIN: Ferritin: 1050 ng/mL — ABNORMAL HIGH (ref 11–307)

## 2016-11-06 NOTE — Progress Notes (Signed)
Offers no complaints  

## 2017-01-18 NOTE — Progress Notes (Deleted)
[facility]  Telephone:(336) [phone] Fax:(336) [phone]  ID: Amanda SpatesDorothy L McMillian Lawrence OB: [birth year]  MR#: [id number]  QIO#:[id number]#:[id number]  Patient Care Team: Marisue IvanLinthavong, Kanhka, MD as PCP - General (Family Medicine) Oh, Ezzard StandingPaul Y, MD (Inactive) as Physician Assistant (Internal Medicine)  CHIEF COMPLAINT: Iron deficiency anemia  INTERVAL HISTORY: Patient returns to clinic today for repeat laboratory work, further evaluation, and consideration of IV iron. She currently feels well and is asymptomatic. She has no neurologic complaints. She denies any recent fevers or illnesses. She has a good appetite and denies weight loss. She denies any chest pain or shortness of breath. She denies any nausea, vomiting, constipation, or diarrhea. She has no urinary complaints. Patient offers no specific complaints today.  REVIEW OF SYSTEMS:   Review of Systems  Constitutional: Negative for fever, malaise/fatigue and weight loss.  Respiratory: Negative.  Negative for shortness of breath.   Cardiovascular: Negative.  Negative for chest pain and leg swelling.  Gastrointestinal: Negative.  Negative for abdominal pain, blood in stool and melena.  Musculoskeletal: Positive for joint pain.  Neurological: Negative.  Negative for weakness.  Psychiatric/Behavioral: Negative.     As per HPI. Otherwise, a complete review of systems is negative.  PAST MEDICAL HISTORY: Past Medical History:  Diagnosis Date  . Anemia   . GERD (gastroesophageal reflux disease)   . Hypertension     PAST SURGICAL HISTORY: Past Surgical History:  Procedure Laterality Date  . ABDOMINAL HYSTERECTOMY    . COLONOSCOPY WITH PROPOFOL N/A 10/08/2015   Procedure: COLONOSCOPY WITH PROPOFOL;  Surgeon: Wallace CullensPaul Y Oh, MD;  [facility];  Service: Gastroenterology;  Laterality: N/A;  . ESOPHAGOGASTRODUODENOSCOPY (EGD) WITH PROPOFOL N/A 10/08/2015   Procedure: ESOPHAGOGASTRODUODENOSCOPY (EGD) WITH PROPOFOL;  Surgeon:  Wallace CullensPaul Y Oh, MD;  [facility];  Service: Gastroenterology;  Laterality: N/A;    FAMILY HISTORY: Reviewed and unchanged. No reported history of malignancy or chronic disease.     ADVANCED DIRECTIVES:    HEALTH MAINTENANCE: Social History  Substance Use Topics  . Smoking status: Never Smoker  . Smokeless tobacco: Not on file  . Alcohol use No     Colonoscopy:  PAP:  Bone density:  Lipid panel:  Allergies  Allergen Reactions  . Penicillins Anaphylaxis    Has patient had a PCN reaction causing immediate rash, facial/tongue/throat swelling, SOB or lightheadedness with hypotension: unsure Has patient had a PCN reaction causing severe rash involving mucus membranes or skin necrosis: unsure Has patient had a PCN reaction that required hospitalization: unsure Has patient had a PCN reaction occurring within the last 10 years: unsure If all of the above answers are "NO", then may proceed with Cephalosporin use. Has patient had a PCN reaction causing immediate rash, facial/tongue/throat swelling, SOB or lightheadedness with hypotension: unsure Has patient had a PCN reaction causing severe rash involving mucus membranes or skin necrosis: unsure Has patient had a PCN reaction that required hospitalization: unsure Has patient had a PCN reaction occurring within the last 10 years: unsure If all of the above answers are "NO", then may proceed with Cephalosporin use.     Current Outpatient Prescriptions  Medication Sig Dispense Refill  . aspirin EC 81 MG EC tablet Take 1 tablet (81 mg total) by mouth daily. 30 tablet 1  . lisinopril (PRINIVIL,ZESTRIL) 20 MG tablet Take 1 tablet (20 mg total) by mouth daily. 30 tablet 1  . pantoprazole (PROTONIX) 20 MG tablet Take 20 mg by mouth daily.      No  current facility-administered medications for this visit.     OBJECTIVE: There were no vitals filed for this visit.   There is no height or weight on file to calculate BMI.    ECOG FS:0  - Asymptomatic  General: Well-developed, well-nourished, no acute distress. Eyes: Pink conjunctiva, anicteric sclera. Lungs: Clear to auscultation bilaterally. Heart: Regular rate and rhythm. No rubs, murmurs, or gallops. Abdomen: Soft, nontender, nondistended. No organomegaly noted, normoactive bowel sounds. Musculoskeletal: No edema, cyanosis, or clubbing. Neuro: Alert, answering all questions appropriately. Cranial nerves grossly intact. Skin: No rashes or petechiae noted. Psych: Normal affect.  LAB RESULTS:  Lab Results  Component Value Date   NA 139 01/16/2016   K 4.0 01/16/2016   CL 108 01/16/2016   CO2 25 01/16/2016   GLUCOSE 98 01/16/2016   BUN 16 01/16/2016   CREATININE 0.71 01/16/2016   CALCIUM 9.4 01/16/2016   PROT 7.0 07/19/2015   ALBUMIN 3.2 (L) 07/19/2015   AST 22 07/19/2015   ALT 16 07/19/2015   ALKPHOS 56 07/19/2015   BILITOT 0.5 07/19/2015   GFRNONAA >60 01/16/2016   GFRAA >60 01/16/2016    Lab Results  Component Value Date   WBC 4.6 11/06/2016   NEUTROABS 3.0 11/06/2016   HGB 11.0 (L) 11/06/2016   HCT 32.6 (L) 11/06/2016   MCV 87.7 11/06/2016   PLT 185 11/06/2016   Lab Results  Component Value Date   IRON 63 11/06/2016   TIBC 270 11/06/2016   IRONPCTSAT 23 11/06/2016    Lab Results  Component Value Date   FERRITIN 1,050 (H) 11/06/2016     STUDIES: No results found.  ASSESSMENT: Iron deficiency anemia.  PLAN:    1. Iron deficiency anemia: Patient's hemoglobin continues to be decreased, but is stable. Her iron stores are within normal limits. No intervention is needed at this time. Patient last received 510 mg of IV Feraheme in August 2017. She also received B-12 injection at that time. The remainder of her laboratory work including hemoglobinopathy profile was either negative or within normal limits. Colonoscopy and EGD on September 10, 2015 were unrevealing. Of note, patient had a normal CT scan of her abdomen and pelvis on July 19, 2015. Patient is retiring in July and then going to [place] for approximately 4 months, therefore she will return to clinic first week of July for repeat laboratory work and further evaluation. If everything continues to remain stable, she likely can be discharged from clinic.  2. Elevated ferritin: Likely elevated as an acute phase reactant. Monitor. 3. Hypertension: Blood pressure continues to be significantly elevated today. Continue current medications as prescribed.  Patient expressed understanding and was in agreement with this plan. She also understands that She can call clinic at any time with any questions, concerns, or complaints.    Jeralyn Ruths, MD   01/18/2017 6:31 PM

## 2017-01-19 ENCOUNTER — Inpatient Hospital Stay: Payer: BLUE CROSS/BLUE SHIELD

## 2017-01-19 ENCOUNTER — Inpatient Hospital Stay: Payer: BLUE CROSS/BLUE SHIELD | Admitting: Oncology

## 2017-03-01 NOTE — Progress Notes (Deleted)
[facility]  Telephone:([phone]) [phone] Fax:(336) [phone]  ID: Amanda Lawrence Pettiford OB: 01/04/1946  MR#: [id number]  QIO#:[id number]  [name], MD as PCP - General (Family Medicine) Oh, Ezzard Standing, MD (Inactive) as Physician Assistant (Internal Medicine)  CHIEF COMPLAINT: Iron deficiency anemia  INTERVAL HISTORY: Patient returns to clinic today for repeat laboratory work, further evaluation, and consideration of IV iron. She currently feels well and is asymptomatic. She has no neurologic complaints. She denies any recent fevers or illnesses. She has a good appetite and denies weight loss. She denies any chest pain or shortness of breath. She denies any nausea, vomiting, constipation, or diarrhea. She has no urinary complaints. Patient offers no specific complaints today.  REVIEW OF SYSTEMS:   Review of Systems  Constitutional: Negative for fever, malaise/fatigue and weight loss.  Respiratory: Negative.  Negative for shortness of breath.   Cardiovascular: Negative.  Negative for chest pain and leg swelling.  Gastrointestinal: Negative.  Negative for abdominal pain, blood in stool and melena.  Musculoskeletal: Positive for joint pain.  Neurological: Negative.  Negative for weakness.  Psychiatric/Behavioral: Negative.     As per HPI. Otherwise, a complete review of systems is negative.  PAST MEDICAL HISTORY: Past Medical History:  Diagnosis Date  . Anemia   . GERD (gastroesophageal reflux disease)   . Hypertension     PAST SURGICAL HISTORY: Past Surgical History:  Procedure Laterality Date  . ABDOMINAL HYSTERECTOMY    . COLONOSCOPY WITH PROPOFOL N/A 10/08/2015   Procedure: COLONOSCOPY WITH PROPOFOL;  Surgeon: [name], MD;  Location[facility] ENDOSCOPY;  Service: Gastroenterology;  Laterality: N/A;  . ESOPHAGOGASTRODUODENOSCOPY (EGD) WITH PROPOFOL N/A 10/08/2015   Procedure: ESOPHAGOGASTRODUODENOSCOPY (EGD) WITH PROPOFOL;  Surgeon:  [name], MD;  Location: [facility] ENDOSCOPY;  Service: Gastroenterology;  Laterality: N/A;    FAMILY HISTORY: Reviewed and unchanged. No reported history of malignancy or chronic disease.     ADVANCED DIRECTIVES:    HEALTH MAINTENANCE: Social History  Substance Use Topics  . Smoking status: Never Smoker  . Smokeless tobacco: Not on file  . Alcohol use No     Colonoscopy:  PAP:  Bone density:  Lipid panel:  Allergies  Allergen Reactions  . Penicillins Anaphylaxis    Has patient had a PCN reaction causing immediate rash, facial/tongue/throat swelling, SOB or lightheadedness with hypotension: unsure Has patient had a PCN reaction causing severe rash involving mucus membranes or skin necrosis: unsure Has patient had a PCN reaction that required hospitalization: unsure Has patient had a PCN reaction occurring within the last 10 years: unsure If all of the above answers are "NO", then may proceed with Cephalosporin use. Has patient had a PCN reaction causing immediate rash, facial/tongue/throat swelling, SOB or lightheadedness with hypotension: unsure Has patient had a PCN reaction causing severe rash involving mucus membranes or skin necrosis: unsure Has patient had a PCN reaction that required hospitalization: unsure Has patient had a PCN reaction occurring within the last 10 years: unsure If all of the above answers are "NO", then may proceed with Cephalosporin use.     Current Outpatient Prescriptions  Medication Sig Dispense Refill  . aspirin EC 81 MG EC tablet Take 1 tablet (81 mg total) by mouth daily. 30 tablet 1  . lisinopril (PRINIVIL,ZESTRIL) 20 MG tablet Take 1 tablet (20 mg total) by mouth daily. 30 tablet 1  . pantoprazole (PROTONIX) 20 MG tablet Take 20 mg by mouth daily.      No  current facility-administered medications for this visit.     OBJECTIVE: There were no vitals filed for this visit.   There is no height or weight on file to calculate BMI.    ECOG FS:0  - Asymptomatic  General: Well-developed, well-nourished, no acute distress. Eyes: Pink conjunctiva, anicteric sclera. Lungs: Clear to auscultation bilaterally. Heart: Regular rate and rhythm. No rubs, murmurs, or gallops. Abdomen: Soft, nontender, nondistended. No organomegaly noted, normoactive bowel sounds. Musculoskeletal: No edema, cyanosis, or clubbing. Neuro: Alert, answering all questions appropriately. Cranial nerves grossly intact. Skin: No rashes or petechiae noted. Psych: Normal affect.  LAB RESULTS:  Lab Results  Component Value Date   NA 139 01/16/2016   K 4.0 01/16/2016   CL 108 01/16/2016   CO2 25 01/16/2016   GLUCOSE 98 01/16/2016   BUN 16 01/16/2016   CREATININE 0.71 01/16/2016   CALCIUM 9.4 01/16/2016   PROT 7.0 07/19/2015   ALBUMIN 3.2 (L) 07/19/2015   AST 22 07/19/2015   ALT 16 07/19/2015   ALKPHOS 56 07/19/2015   BILITOT 0.5 07/19/2015   GFRNONAA >60 01/16/2016   GFRAA >60 01/16/2016    Lab Results  Component Value Date   WBC 4.6 11/06/2016   NEUTROABS 3.0 11/06/2016   HGB 11.0 (L) 11/06/2016   HCT 32.6 (L) 11/06/2016   MCV 87.7 11/06/2016   PLT 185 11/06/2016   Lab Results  Component Value Date   IRON 63 11/06/2016   TIBC 270 11/06/2016   IRONPCTSAT 23 11/06/2016    Lab Results  Component Value Date   FERRITIN 1,050 (H) 11/06/2016     STUDIES: No results found.  ASSESSMENT: Iron deficiency anemia.  PLAN:    1. Iron deficiency anemia: Patient's hemoglobin continues to be decreased, but is stable. Her iron stores are within normal limits. No intervention is needed at this time. Patient last received 510 mg of IV Feraheme in August 2017. She also received B-12 injection at that time. The remainder of her laboratory work including hemoglobinopathy profile was either negative or within normal limits. Colonoscopy and EGD on September 10, 2015 were unrevealing. Of note, patient had a normal CT scan of her abdomen and pelvis on July 19, 2015. Patient is retiring in July and then going to [place] months, therefore she will return to clinic first week of July for repeat laboratory work and further evaluation. If everything continues to remain stable, she likely can be discharged from clinic.  2. Elevated ferritin: Likely elevated as an acute phase reactant. Monitor. 3. Hypertension: Blood pressure continues to be significantly elevated today. Continue current medications as prescribed.  Patient expressed understanding and was in agreement with this plan. She also understands that She can call clinic at any time with any questions, concerns, or complaints.    Amanda Ruthsimothy J [name], MD   03/01/2017 10:17 AM

## 2017-03-02 ENCOUNTER — Inpatient Hospital Stay: Payer: Medicare Other | Admitting: Oncology

## 2017-03-02 ENCOUNTER — Inpatient Hospital Stay: Payer: Medicare Other

## 2017-05-16 IMAGING — CT CT ABD-PELV W/ CM
2 of 5 series · 16 of 46 positions shown, 18 images · IV contrast (omnipaque)
Comparison: CT the abdomen and pelvis 10/25/2012.

CLINICAL DATA: 69-year-old female with right-sided flank pain since
8 p.m. yesterday evening.

EXAM:
CT ABDOMEN AND PELVIS WITH CONTRAST
TECHNIQUE: Multidetector CT imaging of the abdomen and pelvis was performed
using the standard protocol following bolus administration of
intravenous contrast.
CONTRAST:  100mL OMNIPAQUE IOHEXOL 300 MG/ML  SOLN

[Series 2: routine abd pel with · axial · 0.66mm/px · z∈[-330,+64]mm · 13 of 89 slices shown, 15 images]
[im 5/89  soft-tissue]
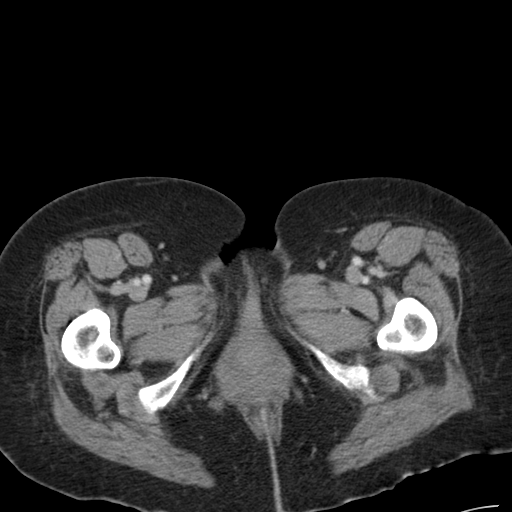
[im 5/89  bone]
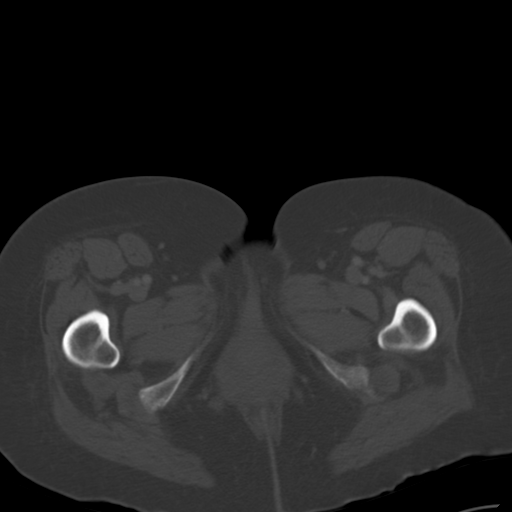
[im 14/89  soft-tissue]
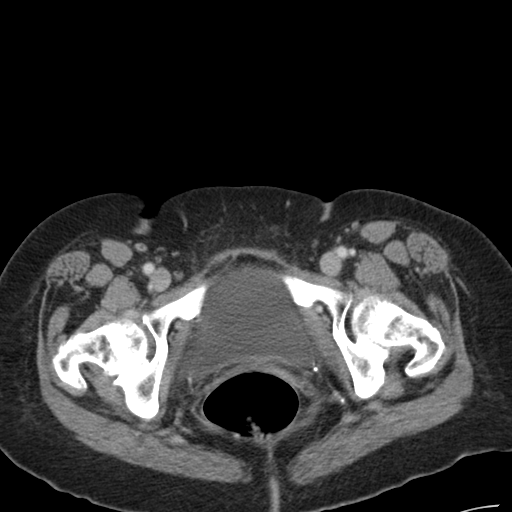
[im 19/89  soft-tissue]
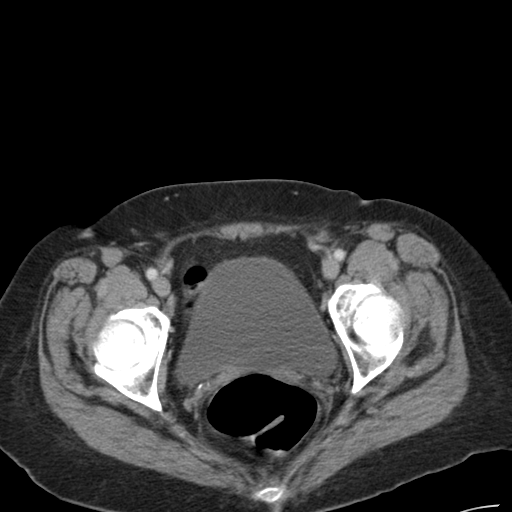
[im 24/89  soft-tissue]
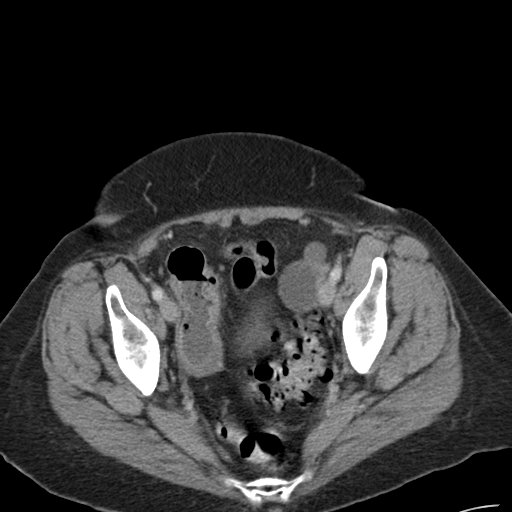
[im 33/89  soft-tissue]
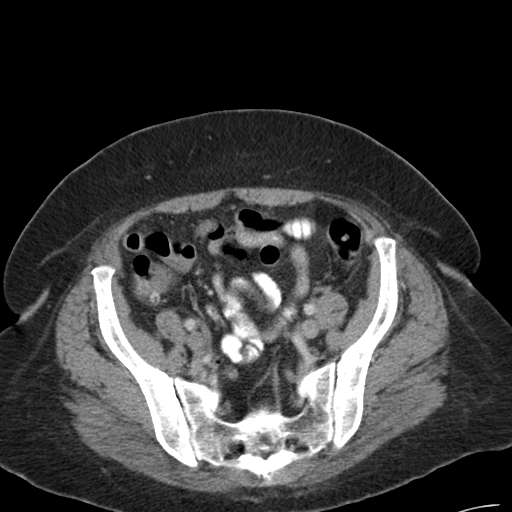
[im 38/89  soft-tissue]
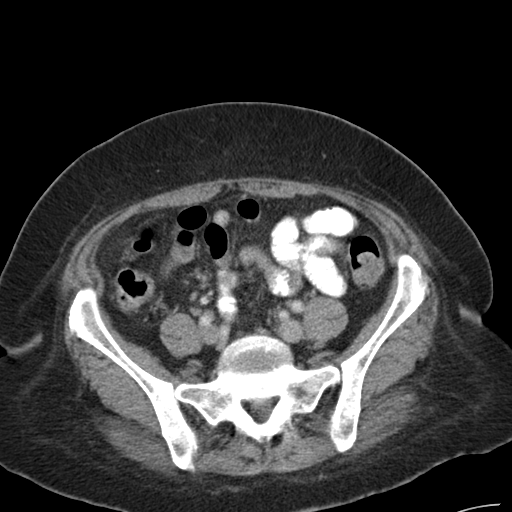
[im 47/89  soft-tissue]
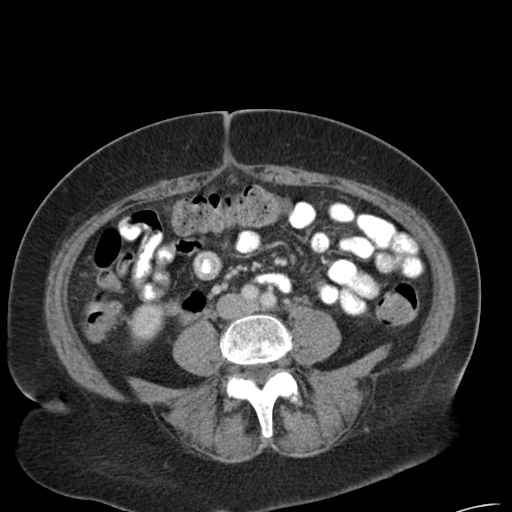
[im 51/89  soft-tissue]
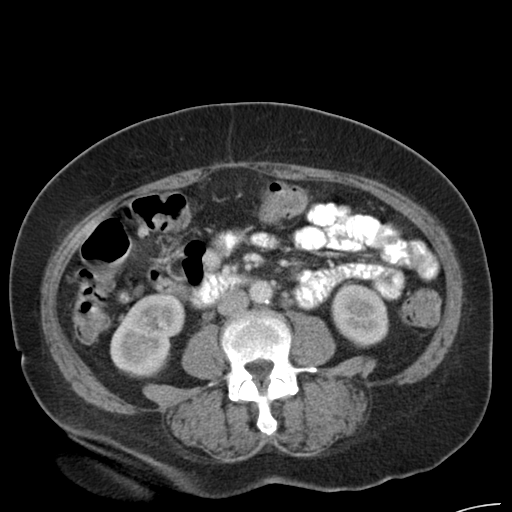
[im 56/89  soft-tissue]
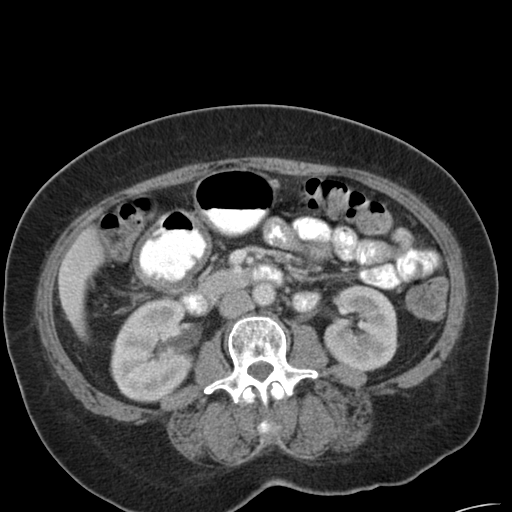
[im 56/89  bone]
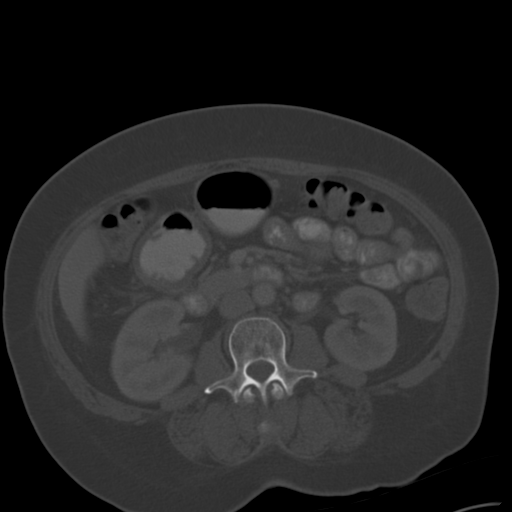
[im 65/89  soft-tissue]
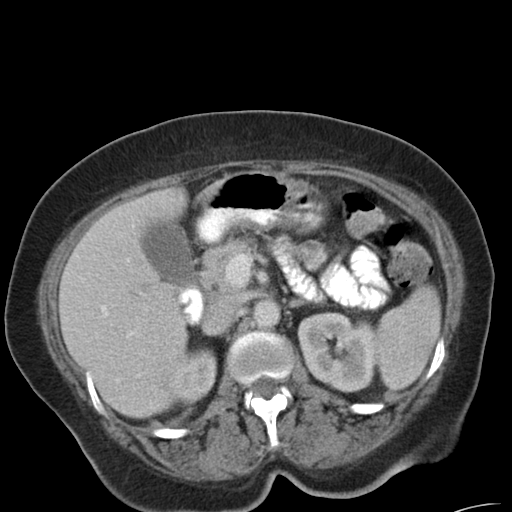
[im 70/89  soft-tissue]
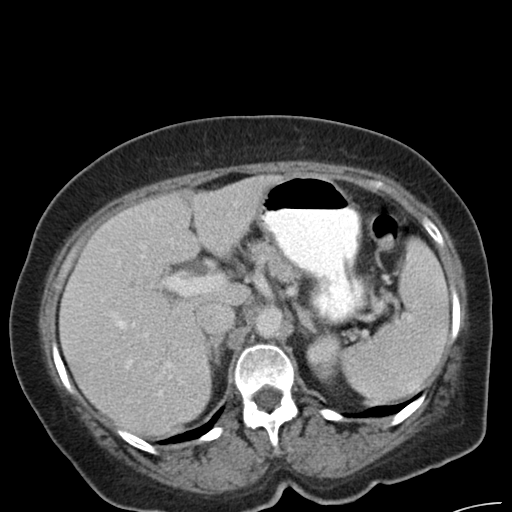
[im 75/89  soft-tissue]
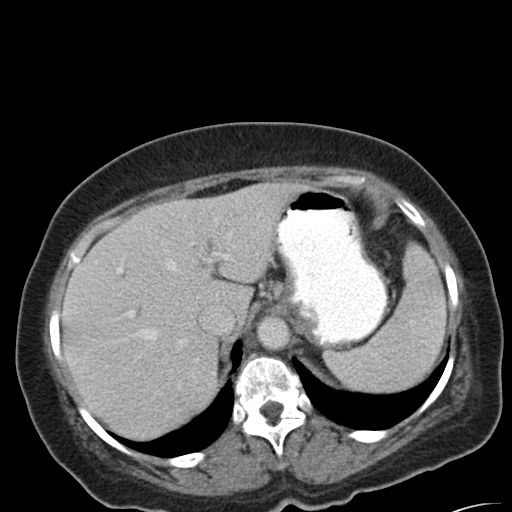
[im 84/89  soft-tissue]
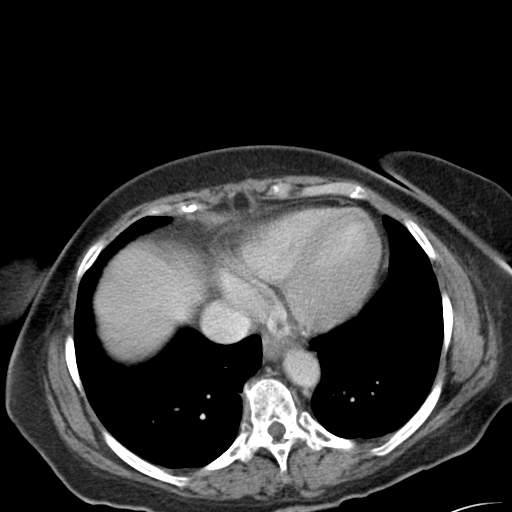

[Series 6: cor routine abd pel with · coronal · 0.89mm/px · 3 of 135 slices shown]
[im 45/135  soft-tissue]
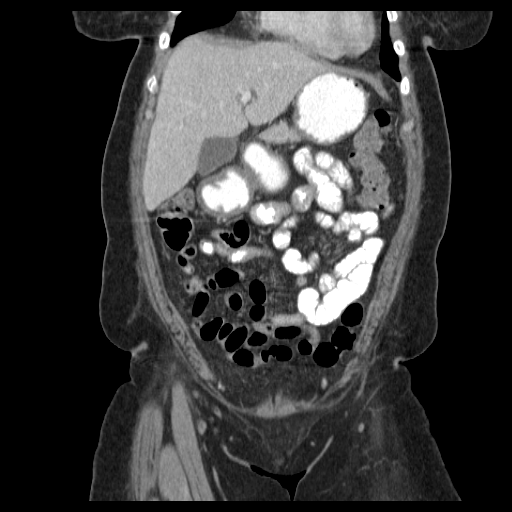
[im 60/135  soft-tissue]
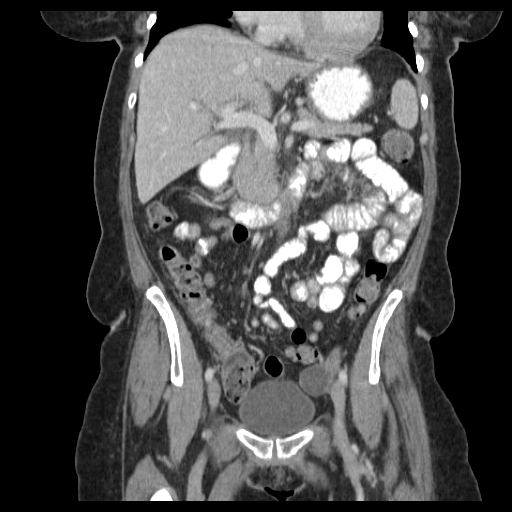
[im 75/135  soft-tissue]
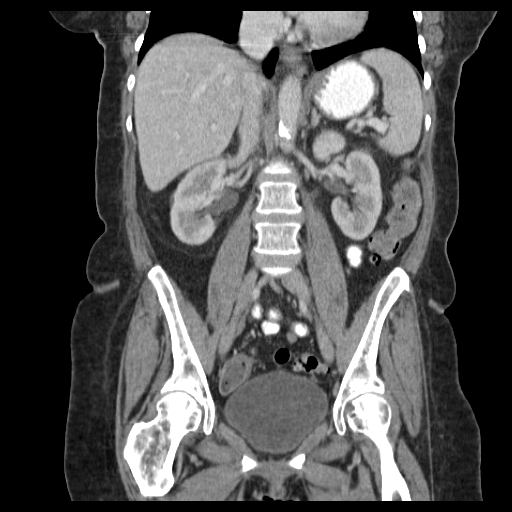

[16 of 46 positions shown; findings below may reference images not displayed]

FINDINGS: Lower chest: Trace right pleural effusion lying dependently. Mild
cardiomegaly.

Hepatobiliary: Amorphous area of decreased perfusion in segment 4B
of the liver adjacent to the falciform ligament, similar to prior
study 10/25/2012, most compatible with a benign perfusion
abnormality. No other suspicious appearing cystic or solid hepatic
lesions are noted. No intra or extrahepatic biliary ductal
dilatation. Gallbladder is normal in appearance.

Pancreas: No pancreatic mass. No pancreatic ductal dilatation. No
pancreatic or peripancreatic fluid or inflammatory changes.

Spleen: Unremarkable.

Adrenals/Urinary Tract: Bilateral adrenal glands and bilateral
kidneys are normal in appearance. No hydroureteronephrosis. Urinary
bladder is normal in appearance.

Stomach/Bowel: Normal appearance of the stomach. No pathologic
dilatation of small bowel or colon. Normal appendix. Several colonic
diverticulae are noted, severe in the sigmoid colon, without
surrounding inflammatory changes to suggest an acute diverticulitis
at this time.

Vascular/Lymphatic: Atherosclerosis throughout the abdominal and
pelvic vasculature, without evidence of aneurysm or dissection. No
lymphadenopathy noted in the abdomen or pelvis.

Reproductive: Status post hysterectomy. Right ovary is atrophic.
Several small lesions in the left ovary appears similar to the prior
study from 10/25/2012, largest of which is a well-defined 3.1 x
cm low-attenuation lesion (image 67 of series 2), likely to
represent an ovarian cyst.

Other: No significant volume of ascites.  No pneumoperitoneum.

Musculoskeletal: There are no aggressive appearing lytic or blastic
lesions noted in the visualized portions of the skeleton.
IMPRESSION: 1. No acute findings in the abdomen or pelvis to account for the
patient's symptoms.
2. Trace right pleural effusion lying dependently.
3. Normal appendix.
4. Colonic diverticulosis without evidence of acute diverticulitis
at this time.
5. Atherosclerosis.
6. Additional incidental findings, similar to prior studies, as
above.

## 2018-05-18 ENCOUNTER — Emergency Department: Payer: Medicare Other

## 2018-05-18 ENCOUNTER — Inpatient Hospital Stay
Admission: EM | Admit: 2018-05-18 | Discharge: 2018-05-19 | DRG: 305 | Disposition: A | Discharge status: Home / Self Care | Payer: Medicare Other | Attending: Family Medicine | Admitting: Family Medicine

## 2018-05-18 ENCOUNTER — Other Ambulatory Visit: Payer: Self-pay

## 2018-05-18 DIAGNOSIS — I4821 Permanent atrial fibrillation: Secondary | ICD-10-CM | POA: Diagnosis present

## 2018-05-18 DIAGNOSIS — I10 Essential (primary) hypertension: Secondary | ICD-10-CM | POA: Diagnosis not present

## 2018-05-18 DIAGNOSIS — Z90710 Acquired absence of both cervix and uterus: Secondary | ICD-10-CM | POA: Diagnosis not present

## 2018-05-18 DIAGNOSIS — K219 Gastro-esophageal reflux disease without esophagitis: Secondary | ICD-10-CM | POA: Diagnosis present

## 2018-05-18 DIAGNOSIS — Z880 Allergy status to penicillin: Secondary | ICD-10-CM | POA: Diagnosis not present

## 2018-05-18 DIAGNOSIS — E876 Hypokalemia: Secondary | ICD-10-CM | POA: Diagnosis present

## 2018-05-18 DIAGNOSIS — I160 Hypertensive urgency: Secondary | ICD-10-CM | POA: Diagnosis present

## 2018-05-18 DIAGNOSIS — Z9114 Patient's other noncompliance with medication regimen: Secondary | ICD-10-CM

## 2018-05-18 LAB — CBC
HCT: 39.4 % (ref 36.0–46.0)
Hemoglobin: 12.4 g/dL (ref 12.0–15.0)
MCH: 28.1 pg (ref 26.0–34.0)
MCHC: 31.5 g/dL (ref 30.0–36.0)
MCV: 89.3 fL (ref 80.0–100.0)
PLATELETS: 217 10*3/uL (ref 150–400)
RBC: 4.41 MIL/uL (ref 3.87–5.11)
RDW: 13.2 % (ref 11.5–15.5)
WBC: 6.5 10*3/uL (ref 4.0–10.5)
nRBC: 0 % (ref 0.0–0.2)

## 2018-05-18 LAB — BASIC METABOLIC PANEL
Anion gap: 12 (ref 5–15)
BUN: 13 mg/dL (ref 8–23)
CALCIUM: 9.4 mg/dL (ref 8.9–10.3)
CHLORIDE: 106 mmol/L (ref 98–111)
CO2: 23 mmol/L (ref 22–32)
CREATININE: 0.79 mg/dL (ref 0.44–1.00)
GFR calc Af Amer: >60 mL/min (ref >60)
GFR calc non Af Amer: >60 mL/min (ref >60)
Glucose, Bld: 82 mg/dL (ref 70–99)
Potassium: 3.4 mmol/L — ABNORMAL LOW (ref 3.5–5.1)
SODIUM: 141 mmol/L (ref 135–145)

## 2018-05-18 LAB — TROPONIN I: Troponin I: <0.03 ng/mL (ref <0.03)

## 2018-05-18 MED ORDER — LABETALOL HCL 5 MG/ML IV SOLN: 10.0000 mg | Freq: Four times a day (QID) | INTRAVENOUS | Status: DC | PRN

## 2018-05-18 MED ORDER — ACETAMINOPHEN 650 MG RE SUPP: 650.0000 mg | Freq: Four times a day (QID) | RECTAL | Status: DC | PRN

## 2018-05-18 MED ORDER — POLYETHYLENE GLYCOL 3350 17 G PO PACK: 17.0000 g | PACK | Freq: Every day | ORAL | Status: DC | PRN

## 2018-05-18 MED ORDER — CLONIDINE HCL 0.1 MG PO TABS
0.1000 mg | ORAL_TABLET | Freq: Once | ORAL | Status: AC
  Administered 2018-05-18: 0.1 mg via ORAL
  Filled 2018-05-18: qty 1

## 2018-05-18 MED ORDER — ONDANSETRON HCL 4 MG PO TABS: 4.0000 mg | ORAL_TABLET | Freq: Four times a day (QID) | ORAL | Status: DC | PRN

## 2018-05-18 MED ORDER — HYDRALAZINE HCL 20 MG/ML IJ SOLN: 10.0000 mg | Freq: Four times a day (QID) | INTRAMUSCULAR | Status: DC | PRN

## 2018-05-18 MED ORDER — HYDRALAZINE HCL 20 MG/ML IJ SOLN
2.0000 mg | Freq: Once | INTRAMUSCULAR | Status: AC
  Administered 2018-05-18: 2 mg via INTRAVENOUS

## 2018-05-18 MED ORDER — HYDROCHLOROTHIAZIDE 25 MG PO TABS
50.0000 mg | ORAL_TABLET | Freq: Every day | ORAL | Status: DC
  Administered 2018-05-19: 50 mg via ORAL
  Filled 2018-05-18: qty 2

## 2018-05-18 MED ORDER — LISINOPRIL 10 MG PO TABS
40.0000 mg | ORAL_TABLET | Freq: Every day | ORAL | Status: DC
  Administered 2018-05-19: 40 mg via ORAL
  Filled 2018-05-18: qty 4

## 2018-05-18 MED ORDER — HYDRALAZINE HCL 20 MG/ML IJ SOLN
5.0000 mg | Freq: Once | INTRAMUSCULAR | Status: DC
  Filled 2018-05-18: qty 1

## 2018-05-18 MED ORDER — HYDRALAZINE HCL 20 MG/ML IJ SOLN
3.0000 mg | Freq: Once | INTRAMUSCULAR | Status: AC
  Administered 2018-05-18: 3 mg via INTRAVENOUS

## 2018-05-18 MED ORDER — METOPROLOL TARTRATE 50 MG PO TABS
50.0000 mg | ORAL_TABLET | Freq: Two times a day (BID) | ORAL | Status: DC
  Administered 2018-05-19: 50 mg via ORAL
  Filled 2018-05-18: qty 1

## 2018-05-18 MED ORDER — PANTOPRAZOLE SODIUM 20 MG PO TBEC
20.0000 mg | DELAYED_RELEASE_TABLET | Freq: Every day | ORAL | Status: DC
Start: 2018-05-18 — End: 2018-05-19
  Administered 2018-05-19: 20 mg via ORAL
  Filled 2018-05-18 (×2): qty 1

## 2018-05-18 MED ORDER — ACETAMINOPHEN 325 MG PO TABS: 650.0000 mg | ORAL_TABLET | Freq: Four times a day (QID) | ORAL | Status: DC | PRN

## 2018-05-18 MED ORDER — POTASSIUM CHLORIDE CRYS ER 20 MEQ PO TBCR
40.0000 meq | EXTENDED_RELEASE_TABLET | Freq: Once | ORAL | Status: AC
  Administered 2018-05-18: 40 meq via ORAL
  Filled 2018-05-18: qty 2

## 2018-05-18 MED ORDER — LISINOPRIL 10 MG PO TABS
20.0000 mg | ORAL_TABLET | ORAL | Status: AC
  Administered 2018-05-18: 20 mg via ORAL
  Filled 2018-05-18: qty 2

## 2018-05-18 MED ORDER — ENOXAPARIN SODIUM 40 MG/0.4ML ~~LOC~~ SOLN
40.0000 mg | SUBCUTANEOUS | Status: DC
  Administered 2018-05-18: 40 mg via SUBCUTANEOUS
  Filled 2018-05-18: qty 0.4

## 2018-05-18 MED ORDER — ASPIRIN EC 81 MG PO TBEC
81.0000 mg | DELAYED_RELEASE_TABLET | Freq: Every day | ORAL | Status: DC
  Administered 2018-05-18 – 2018-05-19 (×2): 81 mg via ORAL
  Filled 2018-05-18 (×2): qty 1

## 2018-05-18 MED ORDER — ONDANSETRON HCL 4 MG/2ML IJ SOLN: 4.0000 mg | Freq: Four times a day (QID) | INTRAMUSCULAR | Status: DC | PRN

## 2018-05-18 NOTE — ED Notes (Signed)
This RN spoke with Hospitalist, Caryn Bee; states ok for patient to go to floor with current blood pressure, plan is to drop BP slowly.

## 2018-05-18 NOTE — Progress Notes (Signed)
Patient BP 171/101 upon arrival to floor. MD Caryn Bee consulted about administration of additional scheduled PO antihypertensives. Per MD, do not give scheduled medications at this time, but if her BP goes up again throughout the night than this RN may administer the medications. Patient and family updated. All questions answered. No concerns at this time. Will continue to monitor.  Mayra Neer M

## 2018-05-18 NOTE — Progress Notes (Signed)
Family Meeting Note  Advance Directive:no  Today a meeting took place with the Patient.  The following clinical team members were present during this meeting:MD  The following were discussed:Patient's diagnosis: Hypertensive urgency, Patient's progosis: > 12 months and Goals for treatment: Full Code  Additional follow-up to be provided: Full code Chaplain consult to start advanced directives  Time spent during discussion: 16 minutes  Amanda Bosso, MD

## 2018-05-18 NOTE — ED Notes (Signed)
Ashley RN, aware of bed assigned  

## 2018-05-18 NOTE — ED Triage Notes (Signed)
Pt states she went to her PCP at [facility] today for her regular check up and they sent her to cardiology and did an ECG that showed a-fib with no previous hx with HTN today. Pt denies any chest pain or SOB.. States she has been out of her b/p meds for 2 weeks because she had been visiting her son in [place] for the past 4 months.

## 2018-05-18 NOTE — ED Notes (Signed)
EDP to bedside to provide patient update in plan of care. 

## 2018-05-18 NOTE — H&P (Signed)
[facility] [place] [facility]   PATIENT NAME: Amanda Lawrence    MR#:  [id number]  DATE OF BIRTH:  12-22-1945  DATE OF ADMISSION:  05/18/2018  PRIMARY CARE PHYSICIAN: Marisue Ivan, MD   REQUESTING/REFERRING PHYSICIAN: dr Fanny Bien  CHIEF COMPLAINT:   High BP HISTORY OF PRESENT ILLNESS:  Amanda Lawrence  is a 72 y.o. female with a known history of essential hypertension on lisinopril who presents from PCP office due to elevated blood pressure.  Patient went to see her primary care physician when she was noted to have high blood pressure and then was sent to the cardiologist office. Her blood pressure at the cardiologist office was greater than 260 so she was sent to the ER for further evaluation.  Patient reports that she ran out of her lisinopril 2 weeks ago.  She has been given clonidine and hydralazine in the emergency room.  She was also restarted on lisinopril however blood pressure remains elevated.  She denies chest pain, shortness of breath, PND, orthopnea.  She denies headache or vision changes.  She denies focal neurological deficits. PAST MEDICAL HISTORY:   Past Medical History:  Diagnosis Date  . Anemia   . GERD (gastroesophageal reflux disease)   . Hypertension     PAST SURGICAL HISTORY:   Past Surgical History:  Procedure Laterality Date  . ABDOMINAL HYSTERECTOMY    . COLONOSCOPY WITH PROPOFOL N/A 10/08/2015   Procedure: COLONOSCOPY WITH PROPOFOL;  Surgeon: [name], MD;  Location: [facility] ENDOSCOPY;  Service: Gastroenterology;  Laterality: N/A;  . ESOPHAGOGASTRODUODENOSCOPY (EGD) WITH PROPOFOL N/A 10/08/2015   Procedure: ESOPHAGOGASTRODUODENOSCOPY (EGD) WITH PROPOFOL;  Surgeon: [name], MD;  Location: [facility] ENDOSCOPY;  Service: Gastroenterology;  Laterality: N/A;    SOCIAL HISTORY:   Social History   Tobacco Use  . Smoking status: Never Smoker  . Smokeless tobacco: Never Used  Substance Use Topics  . Alcohol  use: No    FAMILY HISTORY:  No family history on file.  DRUG ALLERGIES:   Allergies  Allergen Reactions  . Penicillins Anaphylaxis    Has patient had a PCN reaction causing immediate rash, facial/tongue/throat swelling, SOB or lightheadedness with hypotension: unsure Has patient had a PCN reaction causing severe rash involving mucus membranes or skin necrosis: unsure Has patient had a PCN reaction that required hospitalization: unsure Has patient had a PCN reaction occurring within the last 10 years: unsure If all of the above answers are "NO", then may proceed with Cephalosporin use. Has patient had a PCN reaction causing immediate rash, facial/tongue/throat swelling, SOB or lightheadedness with hypotension: unsure Has patient had a PCN reaction causing severe rash involving mucus membranes or skin necrosis: unsure Has patient had a PCN reaction that required hospitalization: unsure Has patient had a PCN reaction occurring within the last 10 years: unsure If all of the above answers are "NO", then may proceed with Cephalosporin use.     REVIEW OF SYSTEMS:   Review of Systems  Constitutional: Negative.  Negative for chills, fever and malaise/fatigue.  HENT: Negative.  Negative for ear discharge, ear pain, hearing loss, nosebleeds and sore throat.   Eyes: Negative.  Negative for blurred vision and pain.  Respiratory: Negative.  Negative for cough, hemoptysis, shortness of breath and wheezing.   Cardiovascular: Negative.  Negative for chest pain, palpitations and leg swelling.  Gastrointestinal: Negative.  Negative for abdominal pain, blood in stool, diarrhea, nausea and vomiting.  Genitourinary: Negative.  Negative for dysuria.  Musculoskeletal: Negative.  Negative for back pain.  Skin: Negative.   Neurological: Negative for dizziness, tremors, speech change, focal weakness, seizures and headaches.  Endo/Heme/Allergies: Negative.  Does not bruise/bleed easily.   Psychiatric/Behavioral: Negative.  Negative for depression, hallucinations and suicidal ideas.    MEDICATIONS AT HOME:   Prior to Admission medications   Medication Sig Start Date End Date Taking? Authorizing Provider  aspirin EC 81 MG EC tablet Take 1 tablet (81 mg total) by mouth daily. 01/16/16   Delfino Lovett, MD  lisinopril (PRINIVIL,ZESTRIL) 20 MG tablet Take 1 tablet (20 mg total) by mouth daily. 01/16/16   Delfino Lovett, MD  pantoprazole (PROTONIX) 20 MG tablet Take 20 mg by mouth daily.     [provider]      VITAL SIGNS:  Blood pressure (!) 209/76, pulse (!) 58, temperature 98.2 F (36.8 C), resp. rate 18, height 5\' 3"  (1.6 m), weight 85.3 kg, SpO2 99 %.  PHYSICAL EXAMINATION:   Physical Exam  Constitutional: She is oriented to person, place, and time. No distress.  HENT:  Head: Normocephalic.  Eyes: No scleral icterus.  Neck: Normal range of motion. Neck supple. No JVD present. No tracheal deviation present.  Cardiovascular: Normal rate, regular rhythm and normal heart sounds. Exam reveals no gallop and no friction rub.  No murmur heard. Pulmonary/Chest: Effort normal and breath sounds normal. No respiratory distress. She has no wheezes. She has no rales. She exhibits no tenderness.  Abdominal: Soft. Bowel sounds are normal. She exhibits no distension and no mass. There is no tenderness. There is no rebound and no guarding.  Musculoskeletal: Normal range of motion. She exhibits no edema.  Neurological: She is alert and oriented to person, place, and time.  Skin: Skin is warm. No rash noted. No erythema.  Psychiatric: Judgment normal.      LABORATORY PANEL:   CBC Recent Labs  Lab 05/18/18 1450  WBC 6.5  HGB 12.4  HCT 39.4  PLT 217   ------------------------------------------------------------------------------------------------------------------  Chemistries  Recent Labs  Lab 05/18/18 1450  NA 141  K 3.4*  CL 106  CO2 23  GLUCOSE 82  BUN 13   CREATININE 0.79  CALCIUM 9.4   ------------------------------------------------------------------------------------------------------------------  Cardiac Enzymes Recent Labs  Lab 05/18/18 1450  TROPONINI <0.03   ------------------------------------------------------------------------------------------------------------------  RADIOLOGY:  Dg Chest 2 View  Result Date: 05/18/2018 CLINICAL DATA:  New onset atrial fibrillation. History of hypertension but had been off her medication for the past 2 weeks. No associated symptoms EXAM: CHEST - 2 VIEW COMPARISON:  Chest x-ray of July 19, 2015 FINDINGS: The lungs are well-expanded. There is no focal infiltrate. The interstitial markings are coarse bilaterally. There is no pleural effusion. The heart is normal in size. The pulmonary vascularity is not clearly engorged. The bony thorax exhibits no acute abnormality. IMPRESSION: There is no CHF nor other acute cardiopulmonary abnormality. Mild interstitial prominence bilaterally likely reflects chronic bronchitic change rather than mild interstitial edema. Electronically Signed   By: David  [place] M.D.   On: 05/18/2018 15:30    EKG:   Normal sinus rhythm occasional PVCs no ST elevation or depression  IMPRESSION AND PLAN:   72 year old female with history of essential hypertension who presents emergency room due to hypertensive urgency.  1.  Hypertensive urgency: Restart lisinopril with increased dose and will add HCTZ Z and metoprolol. Order troponins and continue telemetry monitoring Order TSH Order echocardiogram  2.  Mild hypokalemia: Replete and recheck in a.m.  3.  GERD:  Continue PPI  All the records are reviewed and case discussed with ED provider. Management plans discussed with the patient and she is in agreement  CODE STATUS: FULL  TOTAL TIME TAKING CARE OF THIS PATIENT: 50 minutes.    Itamar Mcgowan M.D on 05/18/2018 at 8:44 PM  Between 7am to 6pm - Pager -  [phone]  After 6pm go to [email or web address] - Social research officer, government  [facility]  Office  [phone]  CC: Primary care physician; Marisue Ivan, MD

## 2018-05-18 NOTE — ED Notes (Signed)
Pt ambulatory to toilet independently. 

## 2018-05-18 NOTE — ED Notes (Signed)
One unsuccessful PIV attempt per this RN; will have another RN attempt via ultrasound.

## 2018-05-18 NOTE — ED Provider Notes (Signed)
[facility] Emergency Department Provider Note   ____________________________________________   First MD Initiated Contact with Patient 05/18/18 1720     (approximate)  I have reviewed the triage vital signs and the nursing notes.   HISTORY  Chief Complaint Irregular Heart Beat    HPI Amanda Lawrence is a 72 y.o. female here for evaluation of high blood pressure  Patient reports she ran out of her lisinopril 2 weeks ago, saw Dr. Burnadette Pop in clinic today and was referred to cardiology for concerns of a new heart rhythm and's very high blood pressure.  Additionally, patient denies chest pain numbness weakness dizziness  nausea or vomiting.  No headache.  No weakness.  Reports she was not have any symptoms when she went to see her doctor today but needed a refill  Past Medical History:  Diagnosis Date  . Anemia   . GERD (gastroesophageal reflux disease)   . Hypertension     Patient Active Problem List   Diagnosis Date Noted  . Iron deficiency anemia 03/04/2016  . Hypertensive urgency   . Essential hypertension, malignant 01/15/2016  . Syncope 01/14/2016    Past Surgical History:  Procedure Laterality Date  . ABDOMINAL HYSTERECTOMY    . COLONOSCOPY WITH PROPOFOL N/A 10/08/2015   Procedure: COLONOSCOPY WITH PROPOFOL;  Surgeon: [name], MD;  Location: [facility] ENDOSCOPY;  Service: Gastroenterology;  Laterality: N/A;  . ESOPHAGOGASTRODUODENOSCOPY (EGD) WITH PROPOFOL N/A 10/08/2015   Procedure: ESOPHAGOGASTRODUODENOSCOPY (EGD) WITH PROPOFOL;  Surgeon: [name], MD;  Location: [facility] ENDOSCOPY;  Service: Gastroenterology;  Laterality: N/A;    Prior to Admission medications   Medication Sig Start Date End Date Taking? Authorizing Provider  ibuprofen (ADVIL,MOTRIN) 200 MG tablet Take 400-800 mg by mouth every 6 (six) hours as needed for headache or mild pain.   Yes [provider]  lisinopril (PRINIVIL,ZESTRIL) 20 MG tablet Take 1  tablet (20 mg total) by mouth daily. 01/16/16  Yes Delfino Lovett, MD  aspirin EC 81 MG EC tablet Take 1 tablet (81 mg total) by mouth daily. Patient not taking: Reported on 05/18/2018 01/16/16   Delfino Lovett, MD    Allergies Penicillins  No family history on file.  Social History Social History   Tobacco Use  . Smoking status: Never Smoker  . Smokeless tobacco: Never Used  Substance Use Topics  . Alcohol use: No  . Drug use: Not Currently    Review of Systems Constitutional: No fever/chills Eyes: No visual changes. ENT: No sore throat. Cardiovascular: Denies chest pain. Respiratory: Denies shortness of breath. Gastrointestinal: No abdominal pain.   Genitourinary: Negative for dysuria. Musculoskeletal: Negative for back pain. Skin: Negative for rash. Neurological: Negative for headaches, areas of focal weakness or numbness.    ____________________________________________   PHYSICAL EXAM:  VITAL SIGNS: ED Triage Vitals  Enc Vitals Group     BP 05/18/18 1431 (!) 191/90     Pulse Rate 05/18/18 1427 63     Resp 05/18/18 1427 17     Temp 05/18/18 1427 98.2 F (36.8 C)     Temp src --      SpO2 05/18/18 1427 100 %     Weight 05/18/18 1430 188 lb (85.3 kg)     Height 05/18/18 1430 5\' 3"  (1.6 m)     Head Circumference --      Peak Flow --      Pain Score 05/18/18 1428 0     Pain Loc --  Pain Edu? --      Excl. in GC? --     Constitutional: Alert and oriented. Well appearing and in no acute distress. Eyes: Conjunctivae are normal. Head: Atraumatic. Nose: No congestion/rhinnorhea. Mouth/Throat: Mucous membranes are moist. Neck: No stridor.  Cardiovascular: Normal rate, regular rhythm. Grossly normal heart sounds.  Good peripheral circulation. Respiratory: Normal respiratory effort.  No retractions. Lungs CTAB. Gastrointestinal: Soft and nontender. No distention. Musculoskeletal: No lower extremity tenderness nor edema. Neurologic:  Normal speech and language.  No gross focal neurologic deficits are appreciated.  Skin:  Skin is warm, dry and intact. No rash noted. Psychiatric: Mood and affect are normal. Speech and behavior are normal.  ____________________________________________   LABS (all labs ordered are listed, but only abnormal results are displayed)  Labs Reviewed  BASIC METABOLIC PANEL - Abnormal; Notable for the following components:      Result Value   Potassium 3.4 (*)    All other components within normal limits  CBC  TROPONIN I  TROPONIN I  TROPONIN I  TROPONIN I  CBC  CREATININE, SERUM  BASIC METABOLIC PANEL   ____________________________________________  EKG  Reviewed and entered by me at 1740 Heart rate 69 cures 85 QTc 460 Normal sinus rhythm, occasional PVC.  No ischemic changes ____________________________________________  RADIOLOGY  Chest x-ray reviewed, no acute noted by me    ____________________________________________   PROCEDURES  Procedure(s) performed: None  Procedures  Critical Care performed: Yes, see critical care note(s)  CRITICAL CARE Performed by: Sharyn Creamer   Total critical care time: 35 minutes  Critical care time was exclusive of separately billable procedures and treating other patients.  Critical care was necessary to treat or prevent imminent or life-threatening deterioration.  Critical care was time spent personally by me on the following activities: development of treatment plan with patient and/or surrogate as well as nursing, discussions with consultants, evaluation of patient's response to treatment, examination of patient, obtaining history from patient or surrogate, ordering and performing treatments and interventions, ordering and review of laboratory studies, ordering and review of radiographic studies, pulse oximetry and re-evaluation of patient's condition.  Severe hypertension, blood pressure documented today as high as 282 systolic, patient refractory to oral  antihypertensives requiring IV antihypertensive therapy.  Patient elevated risk for acute complications due to the severity of her hypertension ____________________________________________   INITIAL IMPRESSION / ASSESSMENT AND PLAN / ED COURSE  Pertinent labs & imaging results that were available during my care of the patient were reviewed by me and considered in my medical decision making (see chart for details).   Patient presents for evaluation for severe hypertension.  Clinic and systolic blood pressures x282 today, additionally was referred to cardiology clinic who sent her here for further evaluation due to an irregular heartbeat.  Speaking with Dr. Lady Gary it does not sound that an EKG was performed at the cardiology clinic, does not appear that an EKG was performed anywhere along the way though there was notation of possible A. fib.  Would be a new diagnosis and the patient is not presently in A. fib.  She does however have severe hypertension, but without symptoms of hypertensive emergency.  Clinical Course as of May 18 2148  Tue May 18, 2018  1738 Dr. Lady Gary advises restart lisinopril, follow-up with cardiology tomorrow AM (10am)   [MQ]  [phone] Case discussed with Dr. Lady Gary, also with the patient and she reports she has not had any side effects of lisinopril but ran out of it  about 2 weeks ago.  Will place back on her lisinopril, plan to follow-up with cardiology at 10 AM.  Monitor blood pressure in the ER for improvement prior to plan for discharge   [MQ]  2035 MAP 113 196/82. Asymptomatic.    [MQ]    Clinical Course User Index [MQ] Sharyn Creamer, MD   ----------------------------------------- 7:03 PM on 05/18/2018 -----------------------------------------  Patient remains asymptomatic, after 1 hour after lisinopril administration the patient's blood pressure has gone up now 163/118.  Ordered oral clonidine and also a small dose of IV  hydralazine.  ----------------------------------------- 8:08 PM on 05/18/2018 -----------------------------------------  Systolic blood pressure down to 206, patient resting comfortably.  Will initiate a small dose of IV hydralazine at this time, favor giving small doses to obtain a therapeutic blood pressure with a goal for me being approximately systolic of 185.  Do not wish to drop her blood pressure too acutely, and she is presently asymptomatic.  Blood pressure was confirmed on both arms to be notably elevated.   ----------------------------------------- 9:48 PM on 05/18/2018 -----------------------------------------  Despite 2 oral antihypertensives plus IV hydralazine the patient's blood pressure still remains elevated to 225/85.  Discussed with hospitalist, will admit for further care and management ____________________________________________   FINAL CLINICAL IMPRESSION(S) / ED DIAGNOSES  Final diagnoses:  Hypertensive urgency        Note:  This document was prepared using Dragon voice recognition software and may include unintentional dictation errors       Sharyn Creamer, MD 05/18/18 2149

## 2018-05-19 DIAGNOSIS — I160 Hypertensive urgency: Secondary | ICD-10-CM | POA: Diagnosis not present

## 2018-05-19 LAB — BASIC METABOLIC PANEL
Anion gap: 9 (ref 5–15)
BUN: 14 mg/dL (ref 8–23)
CALCIUM: 9.2 mg/dL (ref 8.9–10.3)
CO2: 26 mmol/L (ref 22–32)
CREATININE: 0.8 mg/dL (ref 0.44–1.00)
Chloride: 106 mmol/L (ref 98–111)
GFR calc Af Amer: >60 mL/min (ref >60)
GLUCOSE: 97 mg/dL (ref 70–99)
POTASSIUM: 3.6 mmol/L (ref 3.5–5.1)
SODIUM: 141 mmol/L (ref 135–145)

## 2018-05-19 LAB — TROPONIN I
TROPONIN I: 0.03 ng/mL — AB (ref <0.03)
Troponin I: <0.03 ng/mL (ref <0.03)

## 2018-05-19 MED ORDER — HYDROCHLOROTHIAZIDE 50 MG PO TABS: 50.0000 mg | ORAL_TABLET | Freq: Every day | ORAL | 1 refills | Status: AC

## 2018-05-19 MED ORDER — AMLODIPINE BESYLATE 10 MG PO TABS
10.0000 mg | ORAL_TABLET | Freq: Every day | ORAL | Status: DC
  Administered 2018-05-19: 10 mg via ORAL
  Filled 2018-05-19: qty 1

## 2018-05-19 MED ORDER — AMLODIPINE BESYLATE 5 MG PO TABS: 10.0000 mg | ORAL_TABLET | Freq: Every day | ORAL | 1 refills | Status: AC

## 2018-05-19 MED ORDER — LISINOPRIL 40 MG PO TABS: 20.0000 mg | ORAL_TABLET | Freq: Every day | ORAL | 1 refills | Status: AC

## 2018-05-19 MED ORDER — METOPROLOL TARTRATE 50 MG PO TABS: 50.0000 mg | ORAL_TABLET | Freq: Two times a day (BID) | ORAL | 1 refills | Status: AC

## 2018-05-19 NOTE — Consult Note (Signed)
[facility] Cardiology  CARDIOLOGY CONSULT NOTE  Patient ID: Amanda Lawrence MRN: [id number] DOB/AGE: 11-04-1945 72 y.o.  Admit date: 05/18/2018 Referring Physician Dr. Juliene Pina Primary Physician Dr. Burnadette Pop Primary Cardiologist n/a Reason for Consultation Hypertensive urgency   HPI: Amanda Lawrence is a 72 year old female who presented to the ED on 05/18/18 after being seen by her PCP for malignant hypertension.  Blood pressure reading in the office was 282/100 and she was sent up to the cardiology office where it was still severely elevated. She was escorted to the ED for further evaluation.  Amanda Lawrence had a mild headache, but was otherwise symptomatic.  Had not taken lisinopril in 2 weeks because she was vacationing in [place].  ECG done in PCPs office revealed new onset atrial fibrillation with controlled ventricular rate. No previous history of atrial fibrillation.  Today, Amanda Lawrence reports that her headache has resolved with aspirin.  Denies vision changes, chest pain, shortness of breath, or lower extremity swelling.  Notices occasional palpitations with exertion, but is asymptomatic now.  Denies dizziness, lightheadedness, nausea, vomiting, or diarrhea.   No significant personal or family cardiac history to report.   Review of systems complete and found to be negative unless listed above     Past Medical History:  Diagnosis Date  . Anemia   . GERD (gastroesophageal reflux disease)   . Hypertension     Past Surgical History:  Procedure Laterality Date  . ABDOMINAL HYSTERECTOMY    . COLONOSCOPY WITH PROPOFOL N/A 10/08/2015   Procedure: COLONOSCOPY WITH PROPOFOL;  Surgeon: [name], MD;  Location: [facility] ENDOSCOPY;  Service: Gastroenterology;  Laterality: N/A;  . ESOPHAGOGASTRODUODENOSCOPY (EGD) WITH PROPOFOL N/A 10/08/2015   Procedure: ESOPHAGOGASTRODUODENOSCOPY (EGD) WITH PROPOFOL;  Surgeon: [name], MD;  Location: [facility] ENDOSCOPY;  Service: Gastroenterology;   Laterality: N/A;    Medications Prior to Admission  Medication Sig Dispense Refill Last Dose  . ibuprofen (ADVIL,MOTRIN) 200 MG tablet Take 400-800 mg by mouth every 6 (six) hours as needed for headache or mild pain.     Marland Kitchen lisinopril (PRINIVIL,ZESTRIL) 20 MG tablet Take 1 tablet (20 mg total) by mouth daily. 30 tablet 1 Taking  . aspirin EC 81 MG EC tablet Take 1 tablet (81 mg total) by mouth daily. (Patient not taking: Reported on 05/18/2018) 30 tablet 1 Not Taking at Unknown time   Social History   Socioeconomic History  . Marital status: Widowed    Spouse name: Not on file  . Number of children: Not on file  . Years of education: Not on file  . Highest education level: Not on file  Occupational History  . Not on file  Social Needs  . Financial resource strain: Very hard  . Food insecurity:    Worry: Never true    Inability: Never true  . Transportation needs:    Medical: No    Non-medical: No  Tobacco Use  . Smoking status: Never Smoker  . Smokeless tobacco: Never Used  Substance and Sexual Activity  . Alcohol use: No  . Drug use: Not Currently  . Sexual activity: Not on file  Lifestyle  . Physical activity:    Days per week: 0 days    Minutes per session: 0 min  . Stress: To some extent  Relationships  . Social connections:    Talks on phone: More than three times a week    Gets together: More than three times a week    Attends religious service:  More than 4 times per year    Active member of club or organization: No    Attends meetings of clubs or organizations: Never    Relationship status: Widowed  . Intimate partner violence:    Fear of current or ex partner: Patient refused    Emotionally abused: Patient refused    Physically abused: Patient refused    Forced sexual activity: Patient refused  Other Topics Concern  . Not on file  Social History Narrative  . Not on file    History reviewed. No pertinent family history.    Review of systems complete  and found to be negative unless listed above      PHYSICAL EXAM  General: Well developed, well nourished, lying in bed in no acute distress HEENT:  Normocephalic and atramatic Neck:  No JVD.  Lungs: Clear bilaterally to auscultation and percussion. Heart: HRRR . Normal S1 and S2 without gallops or murmurs.  Abdomen: Bowel sounds are positive, abdomen soft and non-tender  Msk:  Back normal. Gait not assessed. Normal strength and tone for age. Extremities: No clubbing, cyanosis or edema.   Neuro: Alert and oriented X 3. Psych:  Good affect, responds appropriately  Labs:   Lab Results  Component Value Date   WBC 6.5 05/18/2018   HGB 12.4 05/18/2018   HCT 39.4 05/18/2018   MCV 89.3 05/18/2018   PLT 217 05/18/2018    Recent Labs  Lab 05/19/18 0444  NA 141  K 3.6  CL 106  CO2 26  BUN 14  CREATININE 0.80  CALCIUM 9.2  GLUCOSE 97   Lab Results  Component Value Date   TROPONINI <0.03 05/19/2018    Lab Results  Component Value Date   CHOL 191 01/15/2016   Lab Results  Component Value Date   HDL 91 01/15/2016   Lab Results  Component Value Date   LDLCALC 86 01/15/2016   Lab Results  Component Value Date   TRIG 69 01/15/2016   Lab Results  Component Value Date   CHOLHDL 2.1 01/15/2016   No results found for: LDLDIRECT    Radiology: Dg Chest 2 View  Result Date: 05/18/2018 CLINICAL DATA:  New onset atrial fibrillation. History of hypertension but had been off her medication for the past 2 weeks. No associated symptoms EXAM: CHEST - 2 VIEW COMPARISON:  Chest x-ray of July 19, 2015 FINDINGS: The lungs are well-expanded. There is no focal infiltrate. The interstitial markings are coarse bilaterally. There is no pleural effusion. The heart is normal in size. The pulmonary vascularity is not clearly engorged. The bony thorax exhibits no acute abnormality. IMPRESSION: There is no CHF nor other acute cardiopulmonary abnormality. Mild interstitial prominence  bilaterally likely reflects chronic bronchitic change rather than mild interstitial edema. Electronically Signed   By: David  [place] M.D.   On: 05/18/2018 15:30    EKG: Normal sinus rhythm with no evidence of acute ischemic changes  ASSESSMENT AND PLAN:  1.  Hypertensive urgency   -Restart lisinopril 10mg  and stress importance of compliance upon discharge; will consider addition of anti-hypertensive medication at discharge  -Okay to continue HCTZ; metoprolol; hydralazine as needed   2. New onset atrial fibrillation   -Has remained in sinus rhythm in the hospital; will advise to follow up outpatient cardiology for Holter monitor placement   -Echocardiogram pending   The history, physical exam findings, and plan of care were all discussed with Dr. Harold Hedge, and all decision making was made in collaboration.  Signed: Andi Hence PA-C 05/19/2018, 8:00 AM

## 2018-05-19 NOTE — Care Management Note (Signed)
Case Management Note  Patient Details  Name: Amanda Lawrence MRN: [id number] Date of Birth: 1946-04-18  Subjective/Objective:     Patient is from home and independent.  Admitted with hypertension.  Medications adjusted.  Patient states she did not take her lisinopril for 2 weeks because she was traveling in [place].  RNCM reviewed patients discharge medications and they are all on the [facility] $4 list.  She gets her medications filled at [facility] on [address].  Has medicare part A&B.  Independent in all adls, denies issues accessing medical care, obtaining medications or with transportation.  Current with PCP.  No discharge needs identified at present by care manager or members of care team.               Action/Plan:   Expected Discharge Date:  05/19/18               Expected Discharge Plan:  Home/Self Care  In-House Referral:     Discharge planning Services  CM Consult  Post Acute Care Choice:    Choice offered to:     DME Arranged:    DME Agency:     HH Arranged:    HH Agency:     Status of Service:  Completed, signed off  If discussed at [employer] of Stay Meetings, dates discussed:    Additional Comments:  Sherren Kerns, RN 05/19/2018, 11:45 AM

## 2018-05-19 NOTE — Discharge Summary (Signed)
[facility] [place] at [facility]   PATIENT NAME: Amanda Lawrence    MR#:  [id number]  DATE OF BIRTH:  [birth year]  DATE OF ADMISSION:  05/18/2018 ADMITTING PHYSICIAN: Adrian Saran, MD  DATE OF DISCHARGE: No discharge date for patient encounter.  PRIMARY CARE PHYSICIAN: Marisue Ivan, MD    ADMISSION DIAGNOSIS:  Hypertensive urgency [I16.0]  DISCHARGE DIAGNOSIS:  Active Problems:   Hypertensive urgency   SECONDARY DIAGNOSIS:   Past Medical History:  Diagnosis Date  . Anemia   . GERD (gastroesophageal reflux disease)   . Hypertension     HOSPITAL COURSE:  72 year old female with history of essential hypertension who presents emergency room due to hypertensive urgency.  *Acute Hypertensive urgency Resolved Due to medication noncompliance-patient ran out of medication for the last 2 weeks Controlled on lisinopril, beta-blocker therapy, hydrochlorothiazide, patient follow-up with primary care provider to 3 days for evaluation  *Acute hypokalemia  Repleted  DISCHARGE CONDITIONS:   stable  CONSULTS OBTAINED:  Treatment Team:  Dalia Heading, MD  DRUG ALLERGIES:   Allergies  Allergen Reactions  . Penicillins Anaphylaxis    Has patient had a PCN reaction causing immediate rash, facial/tongue/throat swelling, SOB or lightheadedness with hypotension: unsure Has patient had a PCN reaction causing severe rash involving mucus membranes or skin necrosis: unsure Has patient had a PCN reaction that required hospitalization: unsure Has patient had a PCN reaction occurring within the last 10 years: unsure If all of the above answers are "NO", then may proceed with Cephalosporin use. Has patient had a PCN reaction causing immediate rash, facial/tongue/throat swelling, SOB or lightheadedness with hypotension: unsure Has patient had a PCN reaction causing severe rash involving mucus membranes or skin necrosis: unsure Has patient had a  PCN reaction that required hospitalization: unsure Has patient had a PCN reaction occurring within the last 10 years: unsure If all of the above answers are "NO", then may proceed with Cephalosporin use.     DISCHARGE MEDICATIONS:   Allergies as of 05/19/2018      Reactions   Penicillins Anaphylaxis   Has patient had a PCN reaction causing immediate rash, facial/tongue/throat swelling, SOB or lightheadedness with hypotension: unsure Has patient had a PCN reaction causing severe rash involving mucus membranes or skin necrosis: unsure Has patient had a PCN reaction that required hospitalization: unsure Has patient had a PCN reaction occurring within the last 10 years: unsure If all of the above answers are "NO", then may proceed with Cephalosporin use. Has patient had a PCN reaction causing immediate rash, facial/tongue/throat swelling, SOB or lightheadedness with hypotension: unsure Has patient had a PCN reaction causing severe rash involving mucus membranes or skin necrosis: unsure Has patient had a PCN reaction that required hospitalization: unsure Has patient had a PCN reaction occurring within the last 10 years: unsure If all of the above answers are "NO", then may proceed with Cephalosporin use.      Medication List    STOP taking these medications   ibuprofen 200 MG tablet Commonly known as:  ADVIL,MOTRIN     TAKE these medications   amLODipine 5 MG tablet Commonly known as:  NORVASC Take 2 tablets (10 mg total) by mouth daily. Start taking on:  05/20/2018   aspirin 81 MG EC tablet Take 1 tablet (81 mg total) by mouth daily.   hydrochlorothiazide 50 MG tablet Commonly known as:  HYDRODIURIL Take 1 tablet (50 mg total) by mouth daily. Start taking on:  05/20/2018  lisinopril 40 MG tablet Commonly known as:  PRINIVIL,ZESTRIL Take 0.5 tablets (20 mg total) by mouth daily. What changed:  medication strength   metoprolol tartrate 50 MG tablet Commonly known as:   LOPRESSOR Take 1 tablet (50 mg total) by mouth 2 (two) times daily.        DISCHARGE INSTRUCTIONS:  If you experience worsening of your admission symptoms, develop shortness of breath, life threatening emergency, suicidal or homicidal thoughts you must seek medical attention immediately by calling 911 or calling your MD immediately  if symptoms less severe.  You Must read complete instructions/literature along with all the possible adverse reactions/side effects for all the Medicines you take and that have been prescribed to you. Take any new Medicines after you have completely understood and accept all the possible adverse reactions/side effects.   Please note  You were cared for by a hospitalist during your hospital stay. If you have any questions about your discharge medications or the care you received while you were in the hospital after you are discharged, you can call the unit and asked to speak with the hospitalist on call if the hospitalist that took care of you is not available. Once you are discharged, your primary care physician will handle any further medical issues. Please note that NO REFILLS for any discharge medications will be authorized once you are discharged, as it is imperative that you return to your primary care physician (or establish a relationship with a primary care physician if you do not have one) for your aftercare needs so that they can reassess your need for medications and monitor your lab values.    Today   CHIEF COMPLAINT:   Chief Complaint  Patient presents with  . Irregular Heart Beat    HISTORY OF PRESENT ILLNESS:  72 y.o. female with a known history of essential hypertension on lisinopril who presents from PCP office due to elevated blood pressure.  Patient went to see her primary care physician when she was noted to have high blood pressure and then was sent to the cardiologist office. Her blood pressure at the cardiologist office was greater than  260 so she was sent to the ER for further evaluation.  Patient reports that she ran out of her lisinopril 2 weeks ago.  She has been given clonidine and hydralazine in the emergency room.  She was also restarted on lisinopril however blood pressure remains elevated.  She denies chest pain, shortness of breath, PND, orthopnea.  She denies headache or vision changes.  She denies focal neurological deficits. VITAL SIGNS:  Blood pressure (!) 151/77, pulse 61, temperature 98.3 F (36.8 C), temperature source Oral, resp. rate 17, height 5\' 6"  (1.676 m), weight 83.5 kg, SpO2 100 %.  I/O:    Intake/Output Summary (Last 24 hours) at 05/19/2018 1057 Last data filed at 05/19/2018 0450 Gross per 24 hour  Intake 240 ml  Output 600 ml  Net -360 ml    PHYSICAL EXAMINATION:  GENERAL:  72 y.o.-year-old patient lying in the bed with no acute distress.  EYES: Pupils equal, round, reactive to light and accommodation. No scleral icterus. Extraocular muscles intact.  HEENT: Head atraumatic, normocephalic. Oropharynx and nasopharynx clear.  NECK:  Supple, no jugular venous distention. No thyroid enlargement, no tenderness.  LUNGS: Normal breath sounds bilaterally, no wheezing, rales,rhonchi or crepitation. No use of accessory muscles of respiration.  CARDIOVASCULAR: S1, S2 normal. No murmurs, rubs, or gallops.  ABDOMEN: Soft, non-tender, non-distended. Bowel sounds present. No organomegaly  or mass.  EXTREMITIES: No pedal edema, cyanosis, or clubbing.  NEUROLOGIC: Cranial nerves II through XII are intact. Muscle strength 5/5 in all extremities. Sensation intact. Gait not checked.  PSYCHIATRIC: The patient is alert and oriented x 3.  SKIN: No obvious rash, lesion, or ulcer.   DATA REVIEW:   CBC Recent Labs  Lab 05/18/18 1450  WBC 6.5  HGB 12.4  HCT 39.4  PLT 217    Chemistries  Recent Labs  Lab 05/19/18 0444  NA 141  K 3.6  CL 106  CO2 26  GLUCOSE 97  BUN 14  CREATININE 0.80  CALCIUM 9.2     Cardiac Enzymes Recent Labs  Lab 05/19/18 0444  TROPONINI <0.03    Microbiology Results  No results found for this or any previous visit.  RADIOLOGY:  Dg Chest 2 View  Result Date: 05/18/2018 CLINICAL DATA:  New onset atrial fibrillation. History of hypertension but had been off her medication for the past 2 weeks. No associated symptoms EXAM: CHEST - 2 VIEW COMPARISON:  Chest x-ray of July 19, 2015 FINDINGS: The lungs are well-expanded. There is no focal infiltrate. The interstitial markings are coarse bilaterally. There is no pleural effusion. The heart is normal in size. The pulmonary vascularity is not clearly engorged. The bony thorax exhibits no acute abnormality. IMPRESSION: There is no CHF nor other acute cardiopulmonary abnormality. Mild interstitial prominence bilaterally likely reflects chronic bronchitic change rather than mild interstitial edema. Electronically Signed   By: David  [place] M.D.   On: 05/18/2018 15:30    EKG:   Orders placed or performed during the hospital encounter of 05/18/18  . ED EKG within 10 minutes  . ED EKG within 10 minutes  . ED EKG  . ED EKG      Management plans discussed with the patient, family and they are in agreement.  CODE STATUS:     Code Status Orders  (From admission, onward)         Start     Ordered   05/18/18 2041  Full code  Continuous     05/18/18 2040        Code Status History    Date Active Date Inactive Code Status Order ID Comments User Context   01/14/2016 1356 01/16/2016 1746 Full Code [id number]  Milagros Loll, MD ED      TOTAL TIME TAKING CARE OF THIS PATIENT: 40 minutes.    Evelena Asa Salary M.D on 05/19/2018 at 10:57 AM  Between 7am to 6pm - Pager - [phone]  After 6pm go to [email or web address] - password [email or web address]  [facility]  Office  [phone]  CC: Primary care physician; Marisue Ivan, MD   Note: This dictation was prepared with Dragon dictation along  with smaller phrase technology. Any transcriptional errors that result from this process are unintentional.

## 2018-05-19 NOTE — Progress Notes (Signed)
IV and tele removed from patient. Discharge instructions given to patient along with hard copy prescriptions. Verbalized understanding. No acute distress at this time. Family at bedside and will transport patient home.

## 2018-05-26 ENCOUNTER — Telehealth: Payer: Self-pay

## 2018-05-26 NOTE — Telephone Encounter (Signed)
Flagged on EMMI report for not reading discharge papers.  First attempt to reach patient made, however unable to reach patient.  Left voicemail encouraging callback. Will attempt at later time.    

## 2018-06-01 NOTE — Telephone Encounter (Signed)
Second attempt made, however unable to reach.  Left another voicemail encouraging callback for any questions or issues she may have regarding her discharge.  No further attempts at this time.

## 2018-06-28 ENCOUNTER — Encounter (INDEPENDENT_AMBULATORY_CARE_PROVIDER_SITE_OTHER): Payer: Self-pay | Admitting: Vascular Surgery

## 2018-06-28 ENCOUNTER — Ambulatory Visit (INDEPENDENT_AMBULATORY_CARE_PROVIDER_SITE_OTHER): Payer: Medicare Other | Admitting: Vascular Surgery

## 2018-06-28 VITALS — BP 176/67 | HR 41 | Resp 17 | Ht 66.5 in | Wt 188.0 lb

## 2018-06-28 DIAGNOSIS — I10 Essential (primary) hypertension: Secondary | ICD-10-CM | POA: Diagnosis not present

## 2018-06-30 ENCOUNTER — Encounter (INDEPENDENT_AMBULATORY_CARE_PROVIDER_SITE_OTHER): Payer: Self-pay | Admitting: Vascular Surgery

## 2018-06-30 NOTE — Progress Notes (Signed)
MRN : [id number]  Amanda Lawrence is a 72 y.o. (July 04, 1946) female who presents with chief complaint of  Chief Complaint  Patient presents with  . New Patient (Initial Visit)    Hypertension consult  .  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 The patient has a long history of hypertension which recently has become increasingly difficult to control utilizing medical therapy. The patient is consistently documented systolic blood pressures near 409 with diastolic pressures over 90.   The patient does have family history of hypertension.   There is no prior documented abdominal bruit. The patient occasionally has flushing symptoms but denies palpitations. No episodes of syncope.There is no history of headache. There is no history of flash pulmonary edema.  The patient denies a history of renal disease.  The patient denies amaurosis fugax or recent TIA symptoms. There are no recent neurological changes noted. The patient denies claudication symptoms or rest pain symptoms. The patient denies history of DVT, PE or superficial thrombophlebitis. The patient denies recent episodes of angina or shortness of breath.     Current Meds  Medication Sig  . amLODipine (NORVASC) 5 MG tablet Take 2 tablets (10 mg total) by mouth daily.  Marland Kitchen aspirin EC 81 MG EC tablet Take 1 tablet (81 mg total) by mouth daily.  . hydrALAZINE (APRESOLINE) 50 MG tablet Take 50 mg by mouth 3 (three) times daily.  . hydrochlorothiazide (HYDRODIURIL) 50 MG tablet Take 1 tablet (50 mg total) by mouth daily.  Marland Kitchen lisinopril (PRINIVIL,ZESTRIL) 40 MG tablet Take 0.5 tablets (20 mg total) by mouth daily.  . metoprolol tartrate (LOPRESSOR) 50 MG tablet Take 1 tablet (50 mg total) by mouth 2 (two) times daily.    Past Medical History:  Diagnosis Date  . Anemia   . GERD (gastroesophageal reflux disease)   . Hypertension      Past Surgical History:  Procedure Laterality Date  . ABDOMINAL HYSTERECTOMY    . COLONOSCOPY WITH PROPOFOL N/A 10/08/2015   Procedure: COLONOSCOPY WITH PROPOFOL;  Surgeon: [name], MD;  Location: [facility] ENDOSCOPY;  Service: Gastroenterology;  Laterality: N/A;  . ESOPHAGOGASTRODUODENOSCOPY (EGD) WITH PROPOFOL N/A 10/08/2015   Procedure: ESOPHAGOGASTRODUODENOSCOPY (EGD) WITH PROPOFOL;  Surgeon: [name], MD;  Location: [facility] ENDOSCOPY;  Service: Gastroenterology;  Laterality: N/A;    Social History Social History   Tobacco Use  . Smoking status: Never Smoker  . Smokeless tobacco: Never Used  Substance Use Topics  . Alcohol use: No  . Drug use: Not Currently    Family History History reviewed. No pertinent family history. No family history of bleeding/clotting disorders, porphyria or autoimmune disease   Allergies  Allergen Reactions  . Penicillins Anaphylaxis    Has patient had a PCN reaction causing immediate rash, facial/tongue/throat swelling, SOB or lightheadedness with hypotension: unsure Has patient had a PCN reaction causing severe rash involving mucus membranes or skin necrosis: unsure Has patient had a PCN reaction that required hospitalization: unsure Has patient had a PCN reaction occurring within the last 10 years: unsure If all of the above answers are "NO", then may proceed with Cephalosporin use. Has patient had a PCN reaction causing immediate rash, facial/tongue/throat swelling, SOB or lightheadedness with hypotension: unsure Has patient had a PCN reaction causing severe rash involving mucus membranes or skin necrosis: unsure Has patient had a PCN reaction that required hospitalization: unsure Has patient had a  PCN reaction occurring within the last 10 years: unsure If all of the above answers are "NO", then may proceed with Cephalosporin use.      REVIEW OF SYSTEMS (Negative unless checked)  Constitutional: [] Weight loss  [] Fever  [] Chills Cardiac:  [] Chest pain   [] Chest pressure   [] Palpitations   [] Shortness of breath when laying flat   [] Shortness of breath with exertion. Vascular:  [] Pain in legs with walking   [] Pain in legs at rest  [] History of DVT   [] Phlebitis   [] Swelling in legs   [] Varicose veins   [] Non-healing ulcers Pulmonary:   [] Uses home oxygen   [] Productive cough   [] Hemoptysis   [] Wheeze  [] COPD   [] Asthma Neurologic:  [] Dizziness   [] Seizures   [] History of stroke   [] History of TIA  [] Aphasia   [] Vissual changes   [] Weakness or numbness in arm   [] Weakness or numbness in leg Musculoskeletal:   [] Joint swelling   [] Joint pain   [] Low back pain Hematologic:  [] Easy bruising  [] Easy bleeding   [] Hypercoagulable state   [] Anemic Gastrointestinal:  [] Diarrhea   [] Vomiting  [] Gastroesophageal reflux/heartburn   [] Difficulty swallowing. Genitourinary:  [] Chronic kidney disease   [] Difficult urination  [] Frequent urination   [] Blood in urine Skin:  [] Rashes   [] Ulcers  Psychological:  [] History of anxiety   []  History of major depression.  Physical Examination  Vitals:   06/28/18 1110  BP: (!) 176/67  Pulse: (!) 41  Resp: 17  Weight: 188 lb (85.3 kg)  Height: 5' 6.5" (1.689 m)   Body mass index is 29.89 kg/m. Gen: WD/WN, NAD Head: [place]/AT, No temporalis wasting.  Ear/Nose/Throat: Hearing grossly intact, nares w/o erythema or drainage, poor dentition Eyes: PER, EOMI, sclera nonicteric.  Neck: Supple, no masses.  No bruit or JVD.  Pulmonary:  Good air movement, clear to auscultation bilaterally, no use of accessory muscles.  Cardiac: RRR, normal S1, S2, no Murmurs. Vascular:  Vessel Right Left  Radial Palpable Palpable  Gastrointestinal: soft, non-distended. No guarding/no peritoneal signs.  Musculoskeletal: M/S 5/5 throughout.  No deformity or atrophy.  Neurologic: CN 2-12 intact. Pain and light touch intact in extremities.  Symmetrical.  Speech is fluent. Motor exam as listed above. Psychiatric: Judgment intact,  Mood & affect appropriate for pt's clinical situation. Dermatologic: No rashes or ulcers noted.  No changes consistent with cellulitis. Lymph : No Cervical lymphadenopathy, no lichenification or skin changes of chronic lymphedema.  CBC Lab Results  Component Value Date   WBC 6.5 05/18/2018   HGB 12.4 05/18/2018   HCT 39.4 05/18/2018   MCV 89.3 05/18/2018   PLT 217 05/18/2018    BMET    Component Value Date/Time   NA 141 05/19/2018 0444   NA 136 10/25/2012 1415   K 3.6 05/19/2018 0444   K 3.3 (L) 10/25/2012 1415   CL 106 05/19/2018 0444   CL 104 10/25/2012 1415   CO2 26 05/19/2018 0444   CO2 26 10/25/2012 1415   GLUCOSE 97 05/19/2018 0444   GLUCOSE 97 10/25/2012 1415   BUN 14 05/19/2018 0444   BUN 14 10/25/2012 1415   CREATININE 0.80 05/19/2018 0444   CREATININE 1.07 10/25/2012 1415   CALCIUM 9.2 05/19/2018 0444   CALCIUM 8.3 (L) 10/25/2012 1415   GFRNONAA >60 05/19/2018 0444   GFRNONAA 54 (L) 10/25/2012 1415   GFRAA >60 05/19/2018 0444   GFRAA >60 10/25/2012 1415   CrCl cannot be calculated (Patient's most recent lab result is older  than the maximum 21 days allowed.).  COAG No results found for: INR, PROTIME  Radiology No results found.   Assessment/Plan 1. Essential hypertension, malignant Given patient's arterial disease optimal control of the patient's hypertension is important. BP is no acceptable today  The patient should undergo noninvasive studies, duplex ultrasound bilateral renal arteries and this will be arranged.  Should hemodynamically significant stenosis be identified then angiography and intervention is indicated.  The patient will continue the current antihypertensive medications, no changes at this time.  The primary medical service will continue aggressive antihypertensive therapy as per the AHA guidelines     Levora DredgeGregory Khianna Blazina, MD  06/30/2018 10:13 AM

## 2018-07-22 ENCOUNTER — Ambulatory Visit (INDEPENDENT_AMBULATORY_CARE_PROVIDER_SITE_OTHER): Payer: Medicare Other

## 2018-07-22 ENCOUNTER — Ambulatory Visit (INDEPENDENT_AMBULATORY_CARE_PROVIDER_SITE_OTHER): Payer: Medicare Other | Admitting: Nurse Practitioner

## 2018-07-22 ENCOUNTER — Encounter (INDEPENDENT_AMBULATORY_CARE_PROVIDER_SITE_OTHER): Payer: Self-pay | Admitting: Nurse Practitioner

## 2018-07-22 VITALS — BP 165/65 | HR 47 | Resp 14 | Ht 66.0 in | Wt 186.8 lb

## 2018-07-22 DIAGNOSIS — K219 Gastro-esophageal reflux disease without esophagitis: Secondary | ICD-10-CM | POA: Diagnosis not present

## 2018-07-22 DIAGNOSIS — I10 Essential (primary) hypertension: Secondary | ICD-10-CM

## 2018-07-27 ENCOUNTER — Encounter (INDEPENDENT_AMBULATORY_CARE_PROVIDER_SITE_OTHER): Payer: Self-pay | Admitting: Nurse Practitioner

## 2018-07-27 DIAGNOSIS — K219 Gastro-esophageal reflux disease without esophagitis: Secondary | ICD-10-CM | POA: Insufficient documentation

## 2018-07-27 NOTE — Progress Notes (Signed)
Subjective:    Patient ID: Amanda Lawrence, female    DOB: 10-26-1945, 73 y.o.   MRN: [id number] Chief Complaint  Patient presents with  . Follow-up    HPI  Amanda Lawrence Lawrence is a 73 y.o. female that presents today for evaluation due to malignant hypertension.  There is concern that her malignant hypertension could be a result of renal artery stenosis.  Her blood pressure recently have ranged within the 1 50-200 range.  There is a family history of hypertension.  The patient denies a history of renal disease.  Patient denies any amaurosis fugax or recent TIA-like symptoms.  There are no recent neurological changes noted.  There are no recent episodes of angina or shortness of breath.  Today the patient underwent a renal artery duplex which revealed normal-sized bilateral kidneys.  No evidence of renal artery stenosis is found.  Past Medical History:  Diagnosis Date  . Anemia   . GERD (gastroesophageal reflux disease)   . Hypertension     Past Surgical History:  Procedure Laterality Date  . ABDOMINAL HYSTERECTOMY    . COLONOSCOPY WITH PROPOFOL N/A 10/08/2015   Procedure: COLONOSCOPY WITH PROPOFOL;  Surgeon: [name], MD;  Location: [facility] ENDOSCOPY;  Service: Gastroenterology;  Laterality: N/A;  . ESOPHAGOGASTRODUODENOSCOPY (EGD) WITH PROPOFOL N/A 10/08/2015   Procedure: ESOPHAGOGASTRODUODENOSCOPY (EGD) WITH PROPOFOL;  Surgeon: [name], MD;  Location: [facility] ENDOSCOPY;  Service: Gastroenterology;  Laterality: N/A;    Social History   Socioeconomic History  . Marital status: Widowed    Spouse name: Not on file  . Number of children: Not on file  . Years of education: Not on file  . Highest education level: Not on file  Occupational History  . Not on file  Social Needs  . Financial resource strain: Very hard  . Food insecurity:    Worry: Never true    Inability: Never true  . Transportation needs:    Medical: No    Non-medical: No  Tobacco Use   . Smoking status: Never Smoker  . Smokeless tobacco: Never Used  Substance and Sexual Activity  . Alcohol use: No  . Drug use: Not Currently  . Sexual activity: Not on file  Lifestyle  . Physical activity:    Days per week: 0 days    Minutes per session: 0 min  . Stress: To some extent  Relationships  . Social connections:    Talks on phone: More than three times a week    Gets together: More than three times a week    Attends religious service: More than 4 times per year    Active member of club or organization: No    Attends meetings of clubs or organizations: Never    Relationship status: Widowed  . Intimate partner violence:    Fear of current or ex partner: Patient refused    Emotionally abused: Patient refused    Physically abused: Patient refused    Forced sexual activity: Patient refused  Other Topics Concern  . Not on file  Social History Narrative  . Not on file    History reviewed. No pertinent family history.  Allergies  Allergen Reactions  . Penicillins Anaphylaxis    Has patient had a PCN reaction causing immediate rash, facial/tongue/throat swelling, SOB or lightheadedness with hypotension: unsure Has patient had a PCN reaction causing severe rash involving mucus membranes or skin necrosis: unsure Has patient had a PCN reaction that required hospitalization: unsure Has  patient had a PCN reaction occurring within the last 10 years: unsure If all of the above answers are "NO", then may proceed with Cephalosporin use. Has patient had a PCN reaction causing immediate rash, facial/tongue/throat swelling, SOB or lightheadedness with hypotension: unsure Has patient had a PCN reaction causing severe rash involving mucus membranes or skin necrosis: unsure Has patient had a PCN reaction that required hospitalization: unsure Has patient had a PCN reaction occurring within the last 10 years: unsure If all of the above answers are "NO", then may proceed with  Cephalosporin use.      Review of Systems   Review of Systems: Negative Unless Checked Constitutional: [] Weight loss  [] Fever  [] Chills Cardiac: [] Chest pain   []  Atrial Fibrillation  [] Palpitations   [] Shortness of breath when laying flat   [] Shortness of breath with exertion. [] Shortness of breath at rest Vascular:  [] Pain in legs with walking   [] Pain in legs with standing [] Pain in legs when laying flat   [] Claudication    [] Pain in feet when laying flat    [] History of DVT   [] Phlebitis   [] Swelling in legs   [] Varicose veins   [] Non-healing ulcers Pulmonary:   [] Uses home oxygen   [] Productive cough   [] Hemoptysis   [] Wheeze  [] COPD   [] Asthma Neurologic:  [] Dizziness   [] Seizures  [] Blackouts [] History of stroke   [] History of TIA  [] Aphasia   [] Temporary Blindness   [] Weakness or numbness in arm   [] Weakness or numbness in leg Musculoskeletal:   [] Joint swelling   [] Joint pain   [] Low back pain  []  History of Knee Replacement [] Arthritis [] back Surgeries  []  Spinal Stenosis    Hematologic:  [] Easy bruising  [] Easy bleeding   [] Hypercoagulable state   [] Anemic Gastrointestinal:  [] Diarrhea   [] Vomiting  [x] Gastroesophageal reflux/heartburn   [] Difficulty swallowing. [] Abdominal pain Genitourinary:  [] Chronic kidney disease   [] Difficult urination  [] Anuric   [] Blood in urine [] Frequent urination  [] Burning with urination   [] Hematuria Skin:  [] Rashes   [] Ulcers [] Wounds Psychological:  [] History of anxiety   []  History of major depression  []  Memory Difficulties     Objective:   Physical Exam  BP (!) 165/65 (BP Location: Right Arm, Patient Position: Sitting)   Pulse (!) 47   Resp 14   Ht 5\' 6"  (1.676 m)   Wt 186 lb 12.8 oz (84.7 kg)   BMI 30.15 kg/m   Gen: WD/WN, NAD Head: [place]/AT, No temporalis wasting.  Ear/Nose/Throat: Hearing grossly intact, nares w/o erythema or drainage Eyes: PER, EOMI, sclera nonicteric.  Neck: Supple, no masses.  No JVD.  Pulmonary:  Good air  movement, no use of accessory muscles.  Cardiac: RRR Vascular:  No abdominal bruit auscultated Vessel Right Left  Radial Palpable Palpable   Gastrointestinal: soft, non-distended. No guarding/no peritoneal signs.  Musculoskeletal: M/S 5/5 throughout.  No deformity or atrophy.  Neurologic: Pain and light touch intact in extremities.  Symmetrical.  Speech is fluent. Motor exam as listed above. Psychiatric: Judgment intact, Mood & affect appropriate for pt's clinical situation. Dermatologic: No Venous rashes. No Ulcers Noted.  No changes consistent with cellulitis. Lymph : No Cervical lymphadenopathy, no lichenification or skin changes of chronic lymphedema.      Assessment & Plan:   1. Essential hypertension, malignant Noninvasive studies today indicate that there is no significant renal artery stenosis.  Today the BP is elevated at 165/65 however it is better controlled than it has been.  Continue  treatment of her hypertension should be continued based off of AHA guidelines.  This will be managed by her primary care physician.  We will follow-up on a as needed basis.  2. GERD without esophagitis Continue PPI as already ordered, this medication has been reviewed and there are no changes at this time.  Avoidence of caffeine and alcohol  Moderate elevation of the head of the bed    Current Outpatient Medications on File Prior to Visit  Medication Sig Dispense Refill  . amLODipine (NORVASC) 5 MG tablet Take 2 tablets (10 mg total) by mouth daily. 90 tablet 1  . aspirin EC 81 MG EC tablet Take 1 tablet (81 mg total) by mouth daily. 30 tablet 1  . hydrALAZINE (APRESOLINE) 50 MG tablet Take 50 mg by mouth 3 (three) times daily.  3  . hydrochlorothiazide (HYDRODIURIL) 50 MG tablet Take 1 tablet (50 mg total) by mouth daily. 90 tablet 1  . lisinopril (PRINIVIL,ZESTRIL) 40 MG tablet Take 0.5 tablets (20 mg total) by mouth daily. 90 tablet 1  . metoprolol tartrate (LOPRESSOR) 50 MG tablet  Take 1 tablet (50 mg total) by mouth 2 (two) times daily. 180 tablet 1   No current facility-administered medications on file prior to visit.     There are no Patient Instructions on file for this visit. No follow-ups on file.   Georgiana SpinnerFallon E Austynn Pridmore, NP  This note was completed with Office managerDragon Dictation.  Any errors are purely unintentional.

## 2019-10-19 ENCOUNTER — Ambulatory Visit: Payer: Medicare Other | Attending: Internal Medicine

## 2019-10-19 DIAGNOSIS — Z23 Encounter for immunization: Secondary | ICD-10-CM

## 2019-10-19 NOTE — Progress Notes (Signed)
   Covid-19 Vaccination Clinic  Name:  Amanda Lawrence    MRN: [id number] DOB: 1946-03-23  10/19/2019  [name] was observed post Covid-19 immunization for 15 minutes without incident. She was provided with Vaccine Information Sheet and instruction to access the V-Safe system.   [name] was instructed to call 911 with any severe reactions post vaccine: Marland Kitchen Difficulty breathing  . Swelling of face and throat  . A fast heartbeat  . A bad rash all over body  . Dizziness and weakness   Immunizations Administered    Name Date Dose VIS Date Route   Pfizer COVID-19 Vaccine 10/19/2019  1:00 PM 0.3 mL 07/01/2019 Intramuscular   Manufacturer: [employer], [employer]   Lot: [id number]   NDC: [address]-2

## 2019-11-13 ENCOUNTER — Ambulatory Visit: Payer: Medicare Other | Attending: Internal Medicine

## 2019-11-13 DIAGNOSIS — Z23 Encounter for immunization: Secondary | ICD-10-CM

## 2019-11-13 NOTE — Progress Notes (Signed)
   Covid-19 Vaccination Clinic  Name:  Amanda Lawrence    MRN: [id number] DOB: 28-Dec-1945  11/13/2019  Amanda Lawrence was observed post Covid-19 immunization for 15 minutes without incident. She was provided with Vaccine Information Sheet and instruction to access the V-Safe system.   Amanda Lawrence was instructed to call 911 with any severe reactions post vaccine: Marland Kitchen Difficulty breathing  . Swelling of face and throat  . A fast heartbeat  . A bad rash all over body  . Dizziness and weakness   Immunizations Administered    Name Date Dose VIS Date Route   Pfizer COVID-19 Vaccine 11/13/2019 12:49 PM 0.3 mL 09/14/2018 Intramuscular   Manufacturer: [employer], [employer]   Lot: [id number]   NDC: [address]-6

## 2023-09-23 ENCOUNTER — Encounter: Payer: Self-pay | Admitting: Oncology

## 2023-09-23 ENCOUNTER — Other Ambulatory Visit: Payer: Self-pay

## 2023-09-23 ENCOUNTER — Encounter: Payer: Self-pay | Admitting: Emergency Medicine

## 2023-09-23 ENCOUNTER — Emergency Department

## 2023-09-23 ENCOUNTER — Emergency Department
Admission: EM | Admit: 2023-09-23 | Discharge: 2023-09-23 | Disposition: A | Discharge status: Home / Self Care | Attending: Emergency Medicine | Admitting: Emergency Medicine

## 2023-09-23 DIAGNOSIS — R519 Headache, unspecified: Secondary | ICD-10-CM | POA: Diagnosis not present

## 2023-09-23 DIAGNOSIS — R0602 Shortness of breath: Secondary | ICD-10-CM | POA: Insufficient documentation

## 2023-09-23 DIAGNOSIS — R079 Chest pain, unspecified: Secondary | ICD-10-CM | POA: Insufficient documentation

## 2023-09-23 LAB — COMPREHENSIVE METABOLIC PANEL
ALT: 13 U/L (ref 0–44)
AST: 21 U/L (ref 15–41)
Albumin: 4.3 g/dL (ref 3.5–5.0)
Alkaline Phosphatase: 68 U/L (ref 38–126)
Anion gap: 9 (ref 5–15)
BUN: 23 mg/dL (ref 8–23)
CO2: 22 mmol/L (ref 22–32)
Calcium: 9.5 mg/dL (ref 8.9–10.3)
Chloride: 104 mmol/L (ref 98–111)
Creatinine, Ser: 1.17 mg/dL — ABNORMAL HIGH (ref 0.44–1.00)
GFR, Estimated: 48 mL/min — ABNORMAL LOW (ref >60)
Glucose, Bld: 106 mg/dL — ABNORMAL HIGH (ref 70–99)
Potassium: 3.6 mmol/L (ref 3.5–5.1)
Sodium: 135 mmol/L (ref 135–145)
Total Bilirubin: 0.8 mg/dL (ref 0.0–1.2)
Total Protein: 8.1 g/dL (ref 6.5–8.1)

## 2023-09-23 LAB — CBC
HCT: 34.8 % — ABNORMAL LOW (ref 36.0–46.0)
Hemoglobin: 11.3 g/dL — ABNORMAL LOW (ref 12.0–15.0)
MCH: 28.6 pg (ref 26.0–34.0)
MCHC: 32.5 g/dL (ref 30.0–36.0)
MCV: 88.1 fL (ref 80.0–100.0)
Platelets: 291 10*3/uL (ref 150–400)
RBC: 3.95 MIL/uL (ref 3.87–5.11)
RDW: 13.9 % (ref 11.5–15.5)
WBC: 7.7 10*3/uL (ref 4.0–10.5)
nRBC: 0 % (ref 0.0–0.2)

## 2023-09-23 LAB — TROPONIN I (HIGH SENSITIVITY)
Troponin I (High Sensitivity): 4 ng/L (ref <18)
Troponin I (High Sensitivity): 5 ng/L (ref <18)

## 2023-09-23 NOTE — ED Provider Notes (Signed)
 [facility] Provider Note    Event Date/Time   First MD Initiated Contact with Patient 09/23/23 1459     (approximate)   History   Chest Pain   HPI  Amanda Lawrence is a 78 y.o. female  who presents to the emergency department today because of concern for chest pain. She first had an episode of chest pain on Monday. Described as pressure like. Located in her left center chest. It then went away but started again today. She was not exerting herself when it started. She has noticed some shortness of breath with exertion recently. Denies any worsening of chest pain with deep breaths. Denies any leg swelling or pain. The patient also states that she has been having headaches which she thinks is due to a new medication.       Physical Exam   Triage Vital Signs: ED Triage Vitals  Encounter Vitals Group     BP 09/23/23 1428 (!) 167/58     Systolic BP Percentile --      Diastolic BP Percentile --      Pulse Rate 09/23/23 1428 65     Resp 09/23/23 1428 18     Temp 09/23/23 1428 98.4 F (36.9 C)     Temp Source 09/23/23 1428 Oral     SpO2 09/23/23 1428 99 %     Weight 09/23/23 1426 169 lb (76.7 kg)     Height 09/23/23 1426 5\' 6"  (1.676 m)     Head Circumference --      Peak Flow --      Pain Score 09/23/23 1426 4     Pain Loc --      Pain Education --      Exclude from Growth Chart --     Most recent vital signs: Vitals:   09/23/23 1428  BP: (!) 167/58  Pulse: 65  Resp: 18  Temp: 98.4 F (36.9 C)  SpO2: 99%   General: Awake, alert, oriented. CV:  Good peripheral perfusion. Regular rate and rhythm. Resp:  Normal effort. Lungs clear. Abd:  No distention.  Other:  No lower extremity edema.    ED Results / Procedures / Treatments   Labs (all labs ordered are listed, but only abnormal results are displayed) Labs Reviewed  CBC - Abnormal; Notable for the following components:      Result Value   Hemoglobin 11.3 (*)    HCT  34.8 (*)    All other components within normal limits  COMPREHENSIVE METABOLIC PANEL - Abnormal; Notable for the following components:   Glucose, Bld 106 (*)    Creatinine, Ser 1.17 (*)    GFR, Estimated 48 (*)    All other components within normal limits  TROPONIN I (HIGH SENSITIVITY)  TROPONIN I (HIGH SENSITIVITY)     EKG  I, Phineas Semen, attending physician, personally viewed and interpreted this EKG  EKG Time: 1423 Rate: 96 Rhythm: sinus rhythm with pac Axis: normal Intervals: qtc 449 QRS: narrow ST changes: no st elevation Impression: abnormal EKG  I, Phineas Semen, attending physician, personally viewed and interpreted this EKG  EKG Time: 1507 Rate: 62 Rhythm: sinus rhythm Axis: normal Intervals: qtc 429 QRS: narrow, q waves v1 III ST changes: no st elevation Impression: abnormal ekg    RADIOLOGY I independently interpreted and visualized the CXR. My interpretation: question pneumonia left upper lobe, right lower lobe Radiology interpretation:  IMPRESSION:  No active cardiopulmonary disease.  PROCEDURES:  Critical Care performed: No   MEDICATIONS ORDERED IN ED: Medications - No data to display   IMPRESSION / MDM / ASSESSMENT AND PLAN / ED COURSE  I reviewed the triage vital signs and the nursing notes.                              Differential diagnosis includes, but is not limited to, ACS, pneumonia, PE, dissection, GERD, MSK  Patient's presentation is most consistent with acute presentation with potential threat to life or bodily function.   The patient is on the cardiac monitor to evaluate for evidence of arrhythmia and/or significant heart rate changes.  Patient presented to the emergency department today because of concerns for chest pressure.  This is now her second episode first 1 occurred 2 days ago.  At the time of my exam the patient states she is no longer having any chest discomfort.  Initial EKG did show some PACs and  had some artifact.  Repeat EKG without any premature beats and without any concerning findings.  Initial troponin negative.  Blood work without concerning leukocytosis.  Will check second troponin.  Second troponin normal. CXR without concerning abnormality. Patient continues to be pain free. At this time I think it is reasonable for patient to be discharged home. Discussed with patient importance of follow up with primary care.    FINAL CLINICAL IMPRESSION(S) / ED DIAGNOSES   Final diagnoses:  Nonspecific chest pain     Note:  This document was prepared using Dragon voice recognition software and may include unintentional dictation errors.    Phineas Semen, MD 09/23/23 [phone]

## 2023-09-23 NOTE — ED Triage Notes (Signed)
 Pt via POV from home. Pt c/o mid-sternal non-radiating cp. Denies cardiac hx. Reports pain started on Monday and got worse today. Also concerns about pt diastolic BP being low. Denies any NV. Reports intermittent SOB. Pt is A&Ox4 and NAD

## 2023-09-23 NOTE — ED Notes (Signed)
 Pt ambulated to in room toilet without assistance from staff. Pt was reconnected to VS monitor and tolerated activity well. Pt's bed is in the lowest, locked position with call bell in reach.

## 2024-07-06 ENCOUNTER — Other Ambulatory Visit: Payer: Self-pay | Admitting: Family Medicine

## 2024-07-06 DIAGNOSIS — Z1231 Encounter for screening mammogram for malignant neoplasm of breast: Secondary | ICD-10-CM

## 2024-08-09 ENCOUNTER — Encounter
